# Patient Record
Sex: Female | Born: 1980 | Hispanic: Yes | Marital: Married | Smoking: Never smoker
Health system: Southern US, Community
[De-identification: ages and names within clinical notes are randomized; demographics above are authoritative.]

## PROBLEM LIST (undated history)

## (undated) DIAGNOSIS — L709 Acne, unspecified: Secondary | ICD-10-CM

## (undated) DIAGNOSIS — F419 Anxiety disorder, unspecified: Secondary | ICD-10-CM

## (undated) DIAGNOSIS — L309 Dermatitis, unspecified: Secondary | ICD-10-CM

## (undated) DIAGNOSIS — R0981 Nasal congestion: Secondary | ICD-10-CM

## (undated) HISTORY — DX: Acne, unspecified: L70.9

## (undated) HISTORY — DX: Dermatitis, unspecified: L30.9

## (undated) HISTORY — PX: TUBAL LIGATION: SHX77

---

## 2010-04-02 ENCOUNTER — Inpatient Hospital Stay: Payer: Self-pay | Admitting: Obstetrics and Gynecology

## 2012-05-10 ENCOUNTER — Ambulatory Visit: Payer: Self-pay | Admitting: Family Medicine

## 2012-05-10 IMAGING — US US OB < 14 WEEKS - US OB TV
1 series · 13 of 28 positions shown · non-contrast
Comparison: none

REASON FOR EXAM: Call Report   [PHONE_NUMBER]   pregnant with paragard
confirm dates placement ...
COMMENTS:

[Series 1: us ob < 14 weeks - us ob tv · 0.23mm/px · 13 of 101 slices shown]
[im 4/101]
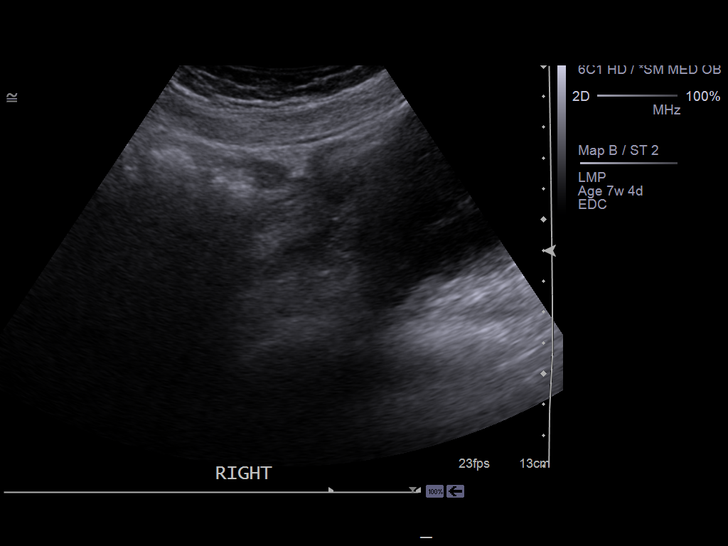
[im 12/101]
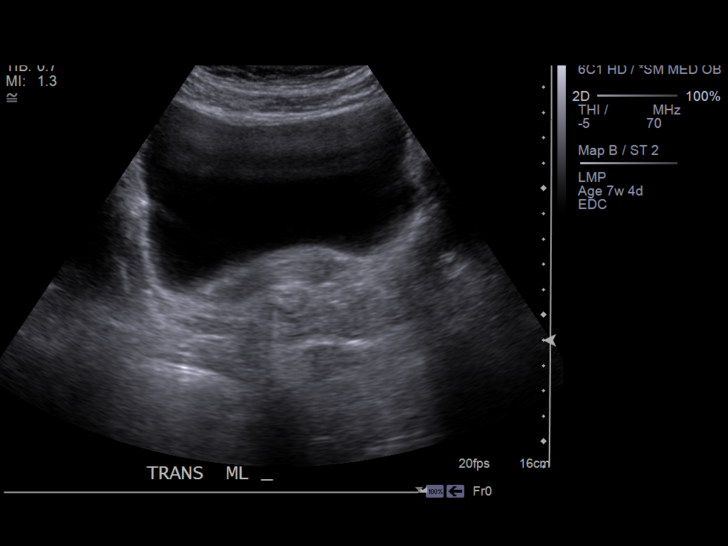
[im 19/101]
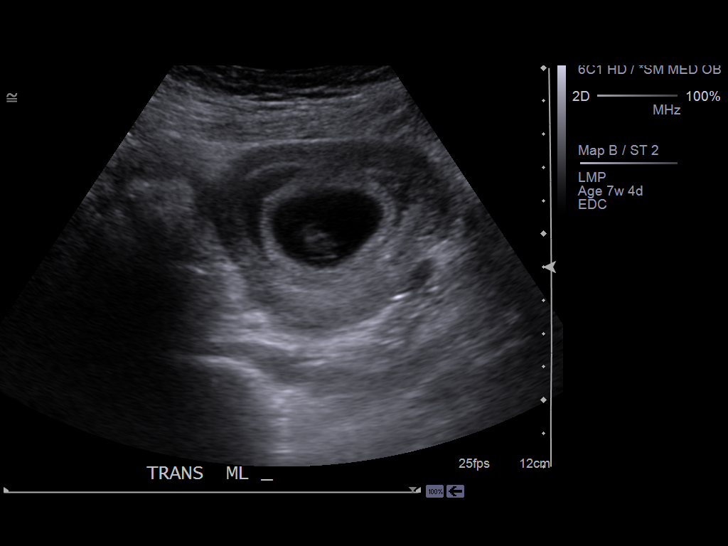
[im 26/101]
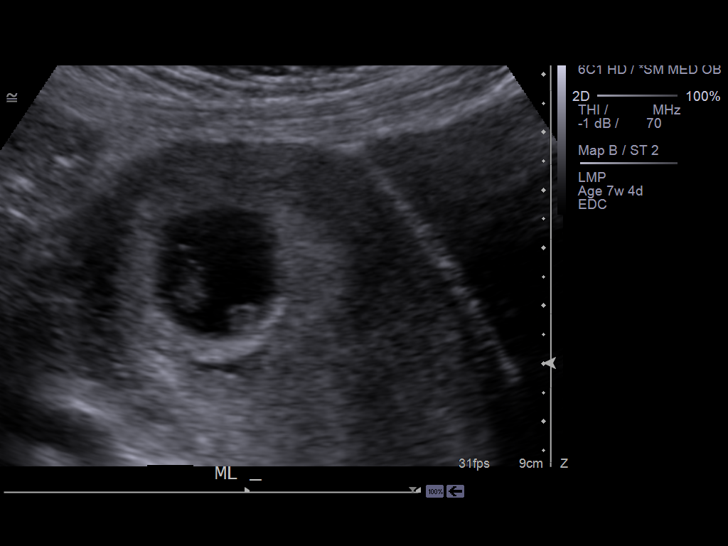
[im 34/101]
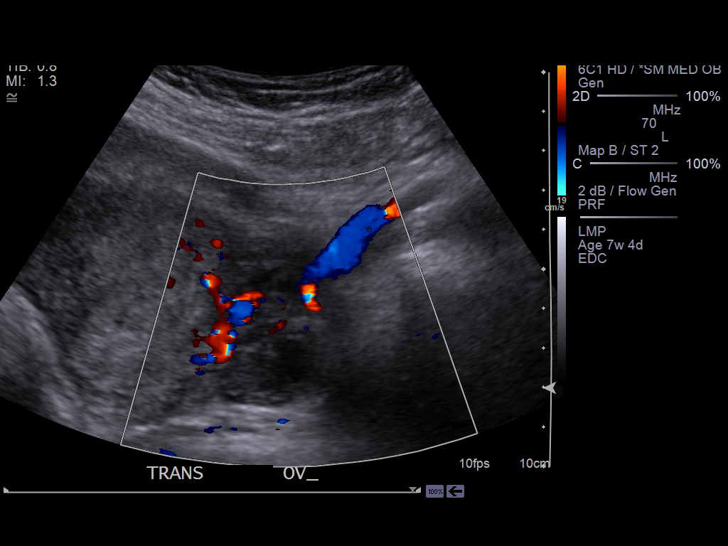
[im 41/101]
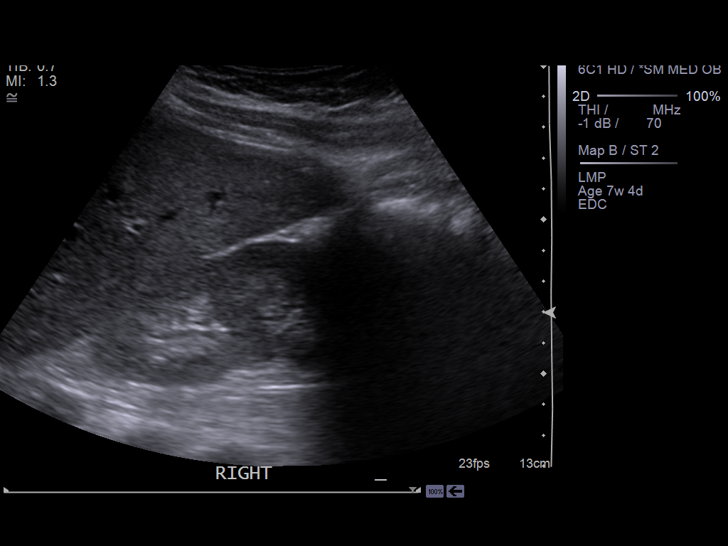
[im 52/101]
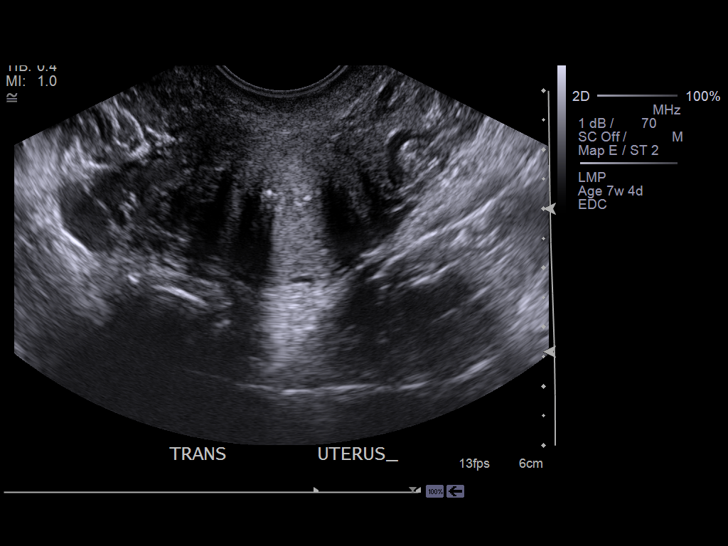
[im 60/101]
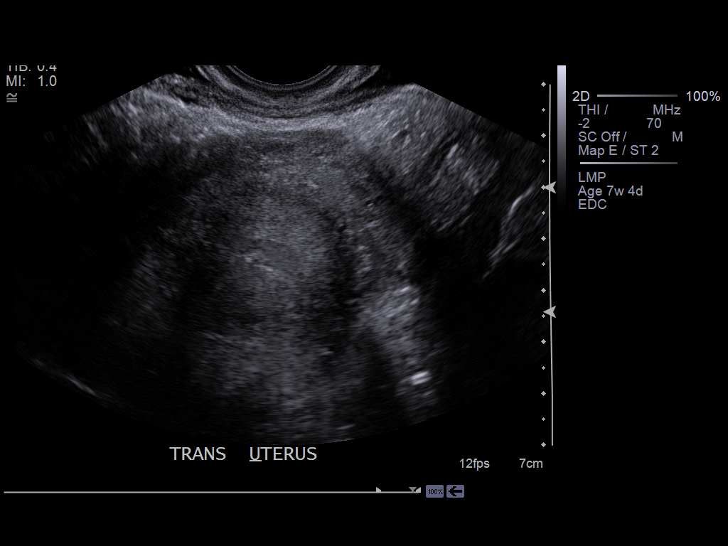
[im 67/101]
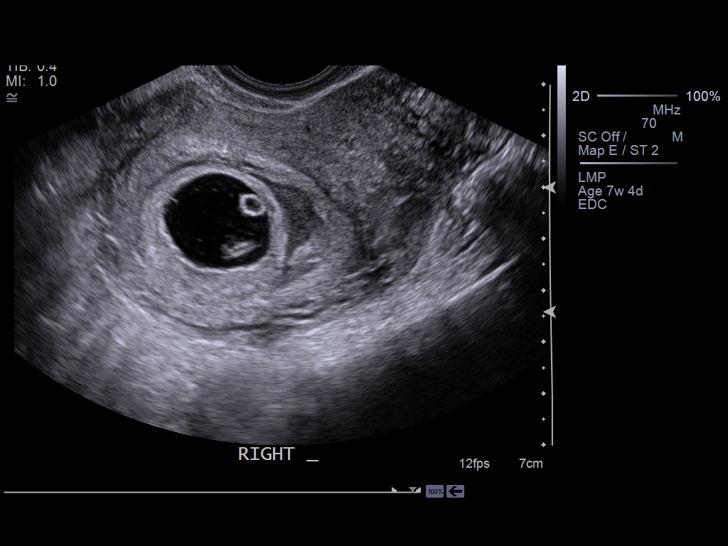
[im 75/101]
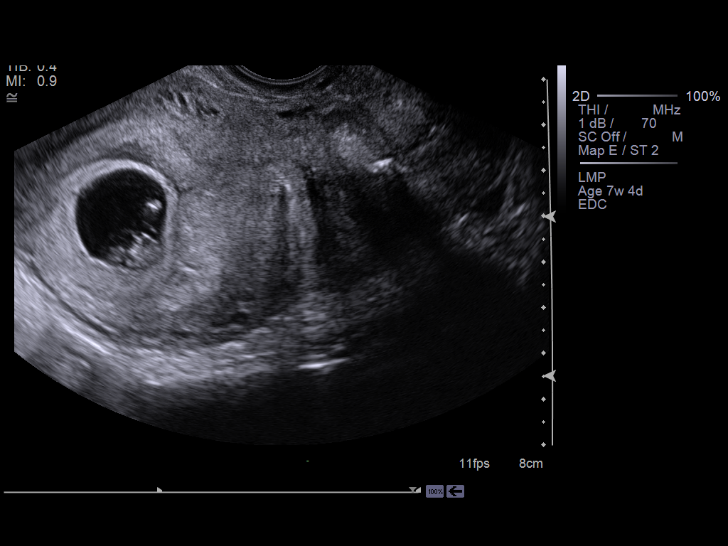
[im 82/101]
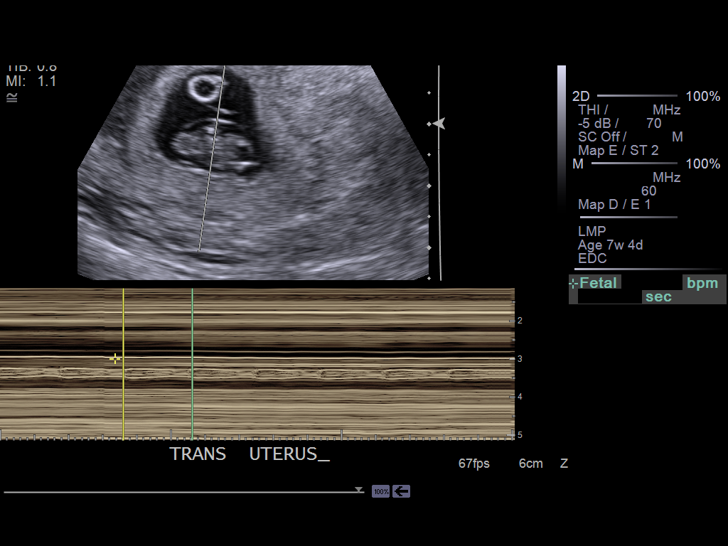
[im 89/101]
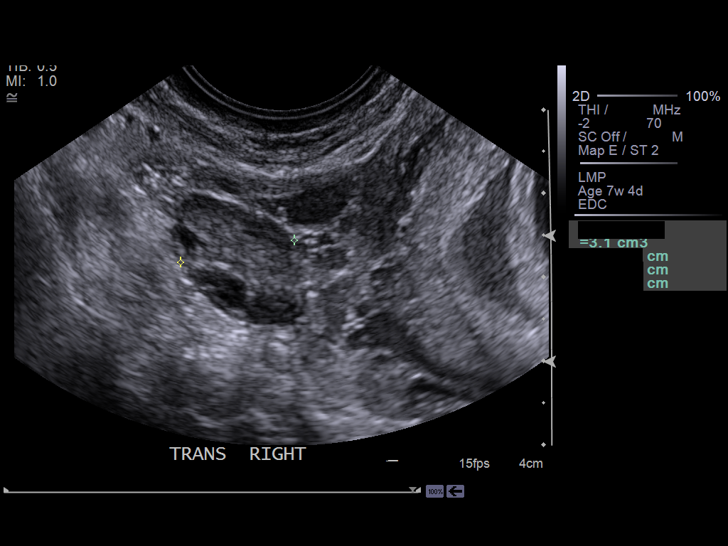
[im 97/101]
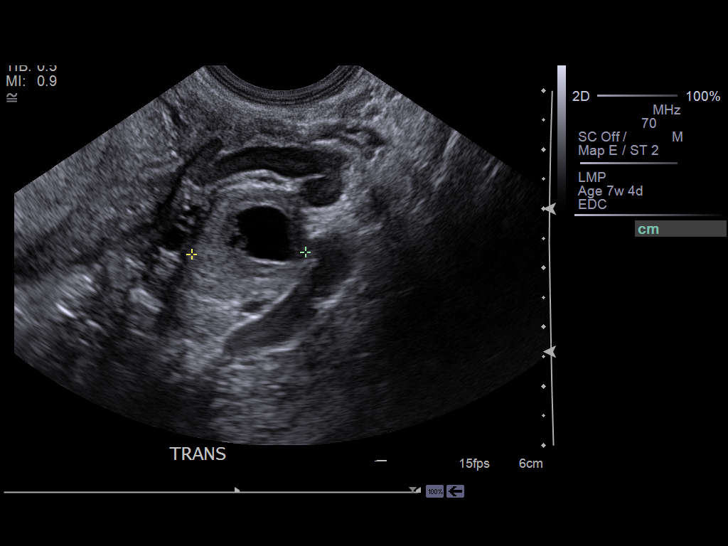

[13 of 28 positions shown; findings below may reference images not displayed]

PROCEDURE:     US  - US OB LESS THAN 14 WEEKS/W TRANS  - [DATE]  [DATE]

RESULT:     Early OB protocol pelvic sonogram shows a single intrauterine
fetus with a crown-rump length mean measurement of 1.29 cm consistent with 7
week 4 day gestation. Fetal heart rate is present 152 beats per minute. Yolk
sac diameter is 0.46 cm. The cervix length has a mean measurement of
cm. Right ovary measures 3.09 x 1.31 x 1.76 cm. The left ovary measures
x 1.88 x 2.96 cm. The in the maternal kidneys appear grossly normal. There
is an echogenic shadowing structure in the lower uterine segment. On
longitudinal and transverse examination this has a T shaped appearance
consistent with an intrauterine device. The amnion appears to be fairly
close to the fetus. High risk assessment followup is suggested. Subchorionic
hemorrhage appears to be present and is measured at 2.26 x 0.90 x 2.40 cm.
IMPRESSION: Intrauterine gestation as described. Amnion is fairly close
to the fetal surface. Fetal heart rate is 149 beats per minute. Possible
implantation subchorionic hemorrhage of 2.26 x 0.90 x 2.40 cm is present.
Follicles are seen in both ovaries with a left ovarian cyst possibly corpus
luteum cyst present.

[REDACTED](*)

## 2012-06-21 ENCOUNTER — Encounter: Payer: Self-pay | Admitting: Obstetrics and Gynecology

## 2012-06-21 IMAGING — US US OB < 14 WEEKS - US OB TV
1 series · 14 of 28 positions shown · non-contrast
Comparison: none

[Series 1: us ob < 14 weeks - us ob tv · 14 of 44 slices shown]
[im 2/44]
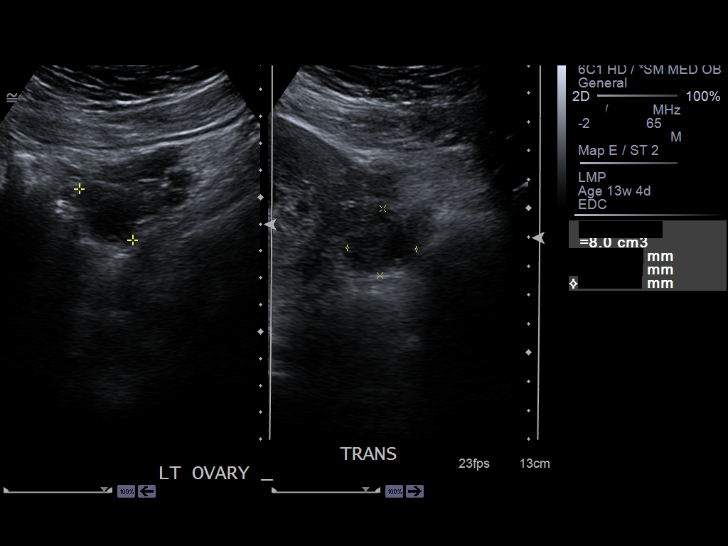
[im 5/44]
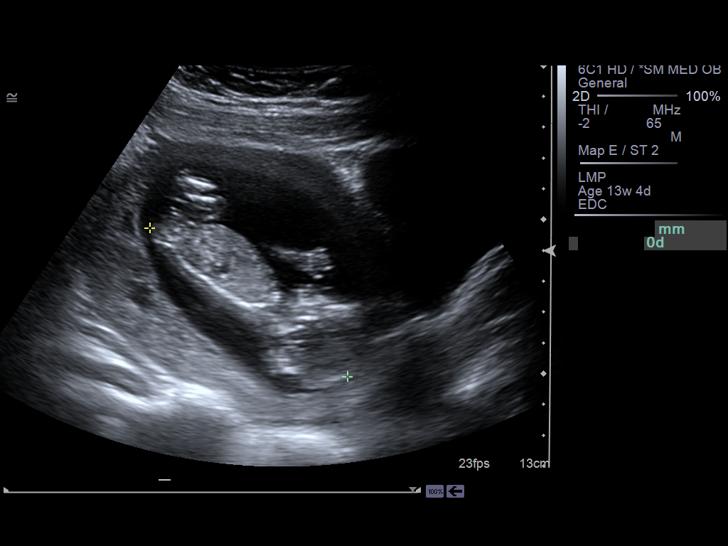
[im 8/44]
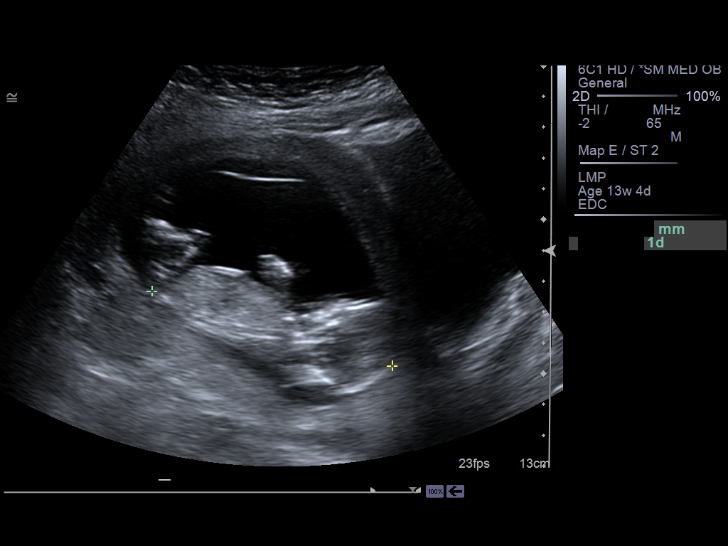
[im 12/44]
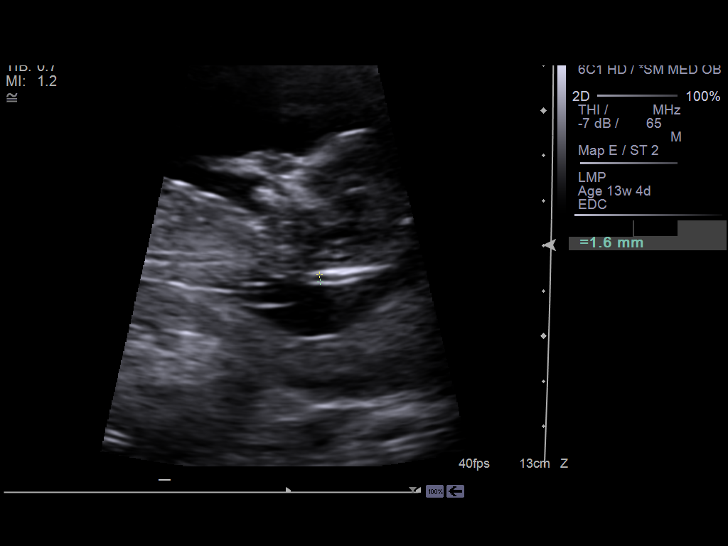
[im 15/44]
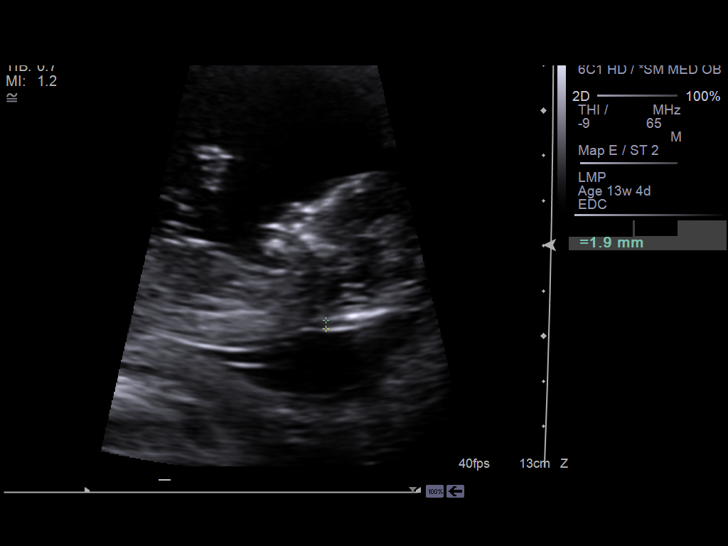
[im 18/44]
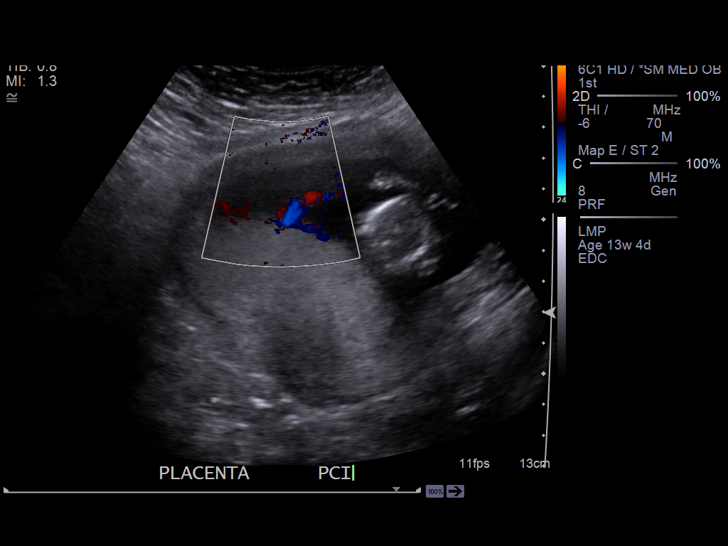
[im 21/44]
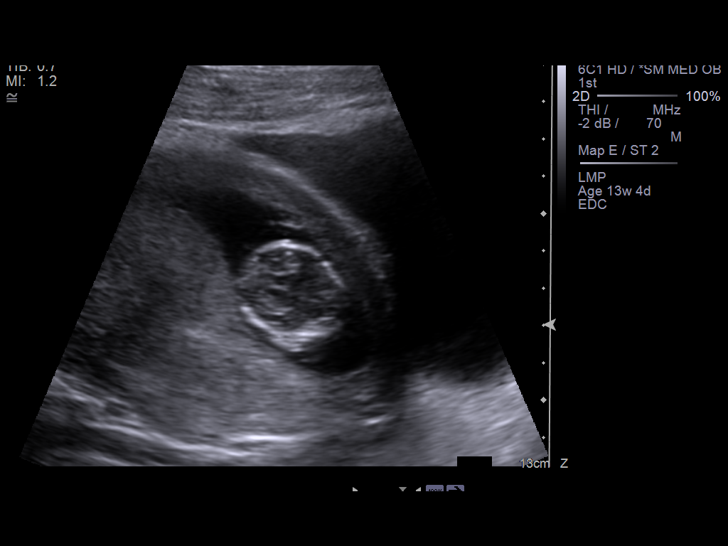
[im 24/44]
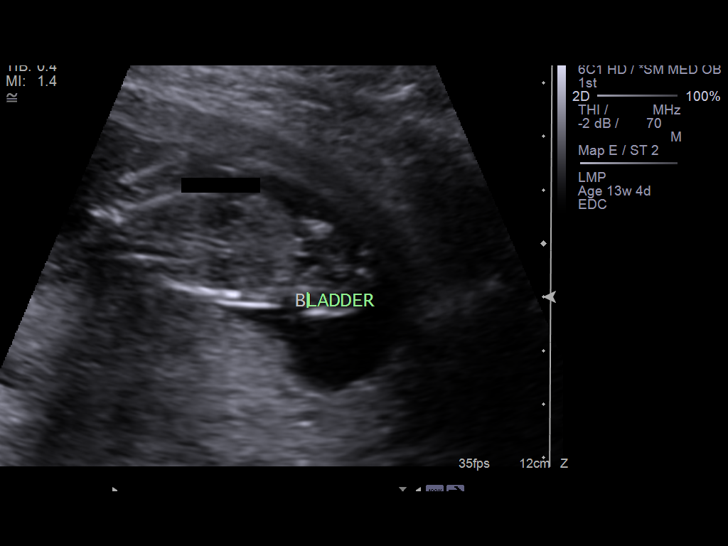
[im 28/44]
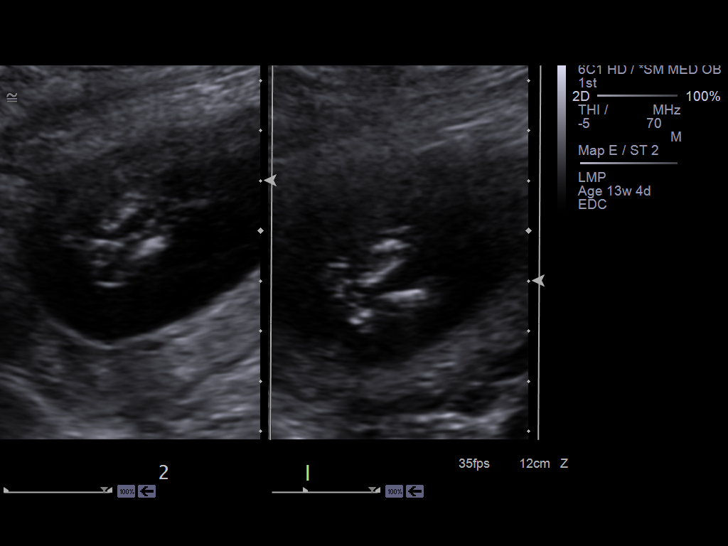
[im 31/44]
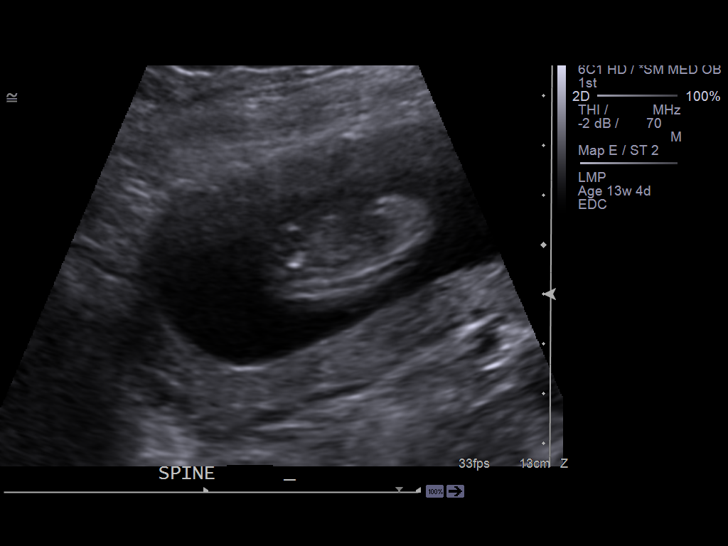
[im 34/44]
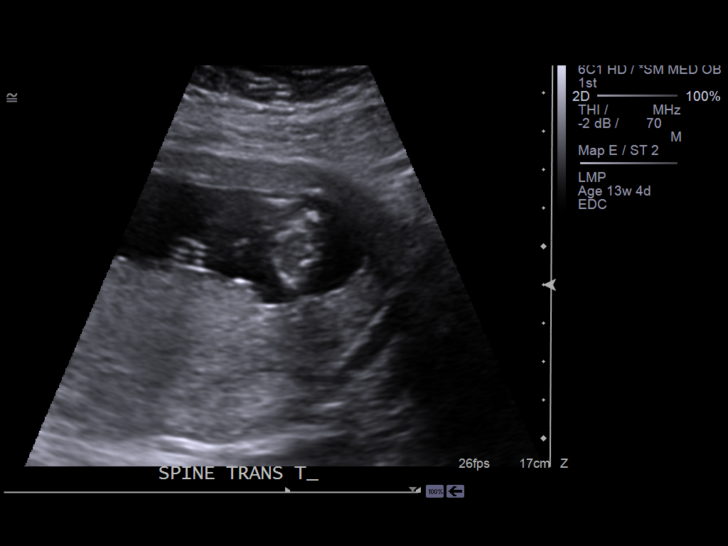
[im 37/44]
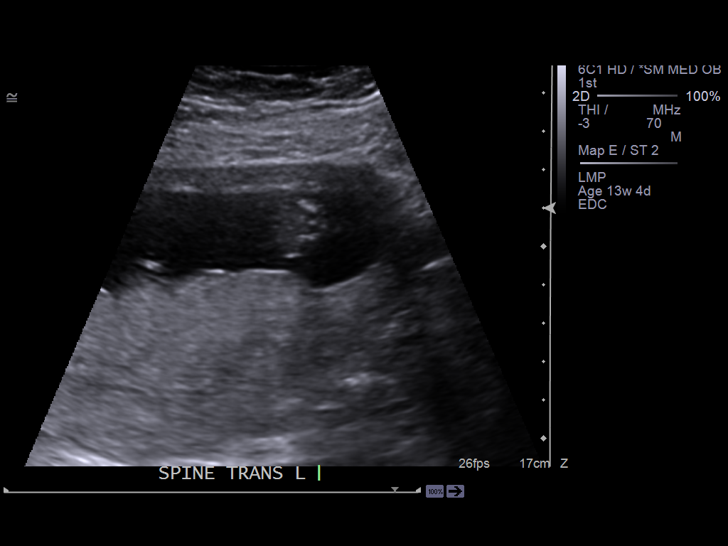
[im 40/44]
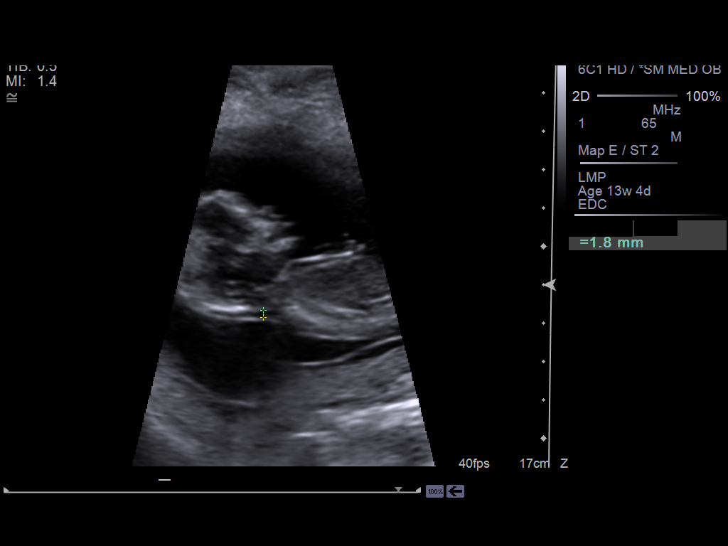
[im 44/44]
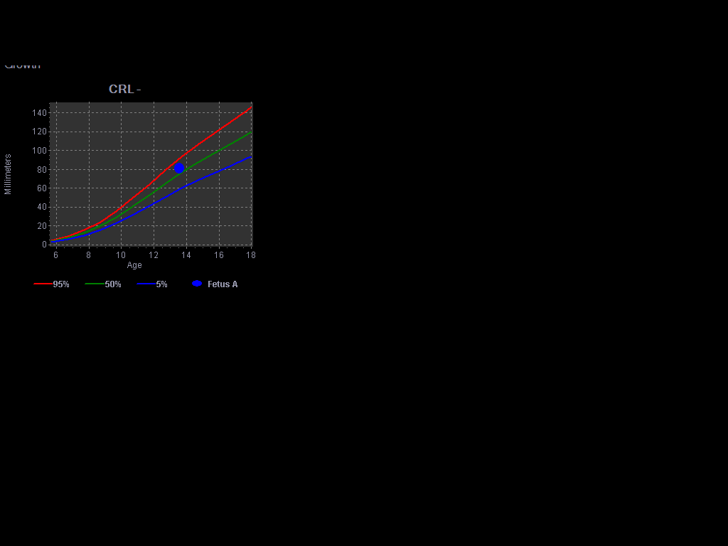

[14 of 28 positions shown; findings below may reference images not displayed]

IMAGES IMPORTED FROM THE [employer]NGO WORKFLOW SYSTEM
NO DICTATION FOR STUDY

## 2012-07-23 ENCOUNTER — Encounter: Payer: Self-pay | Admitting: Obstetrics and Gynecology

## 2012-07-23 IMAGING — US US OB DETAIL+14 WK - NRPT [facility]
1 series · 14 of 28 positions shown · non-contrast
Comparison: none

[Series 1: us ob detail+14 wk - nrpt [facility] · 0.26mm/px · 14 of 100 slices shown]
[im 4/100]
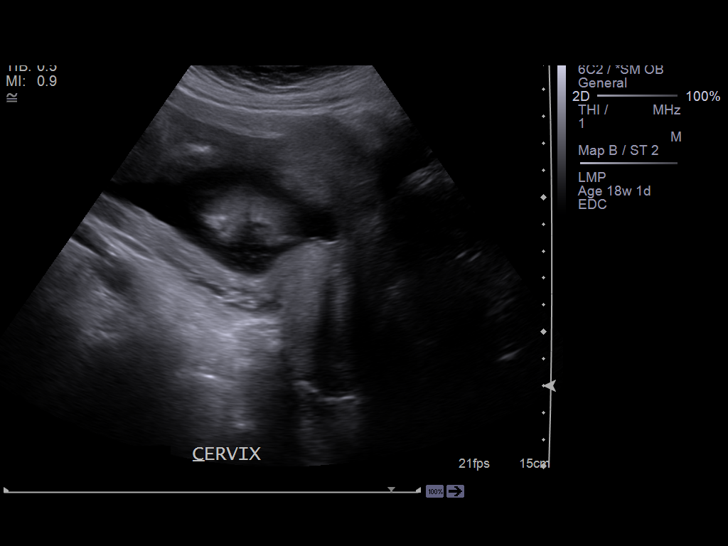
[im 12/100]
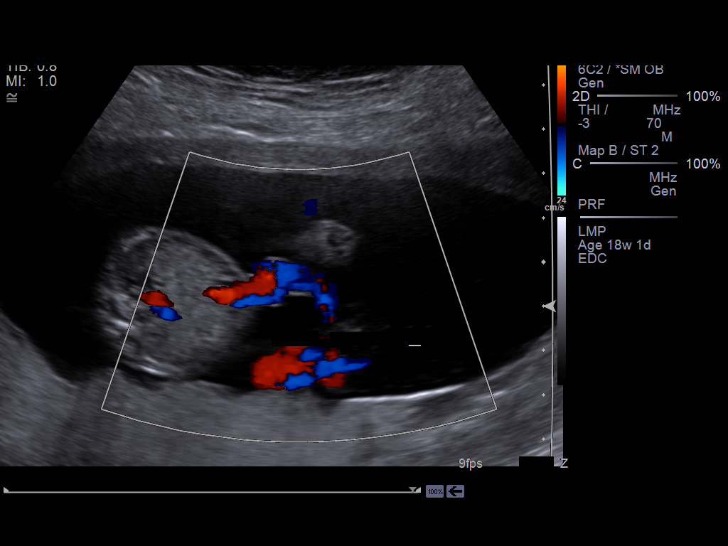
[im 19/100]
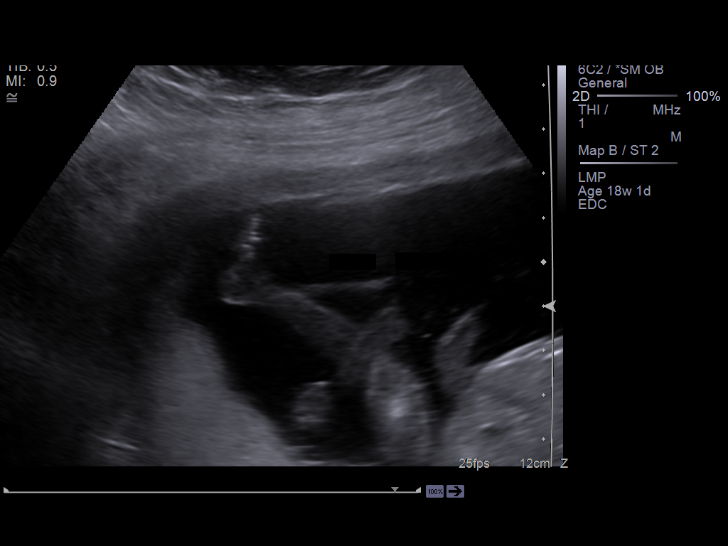
[im 26/100]
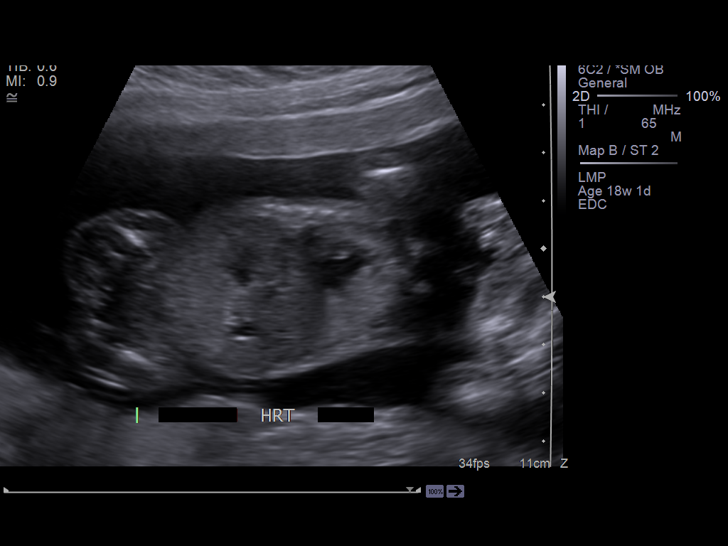
[im 34/100]
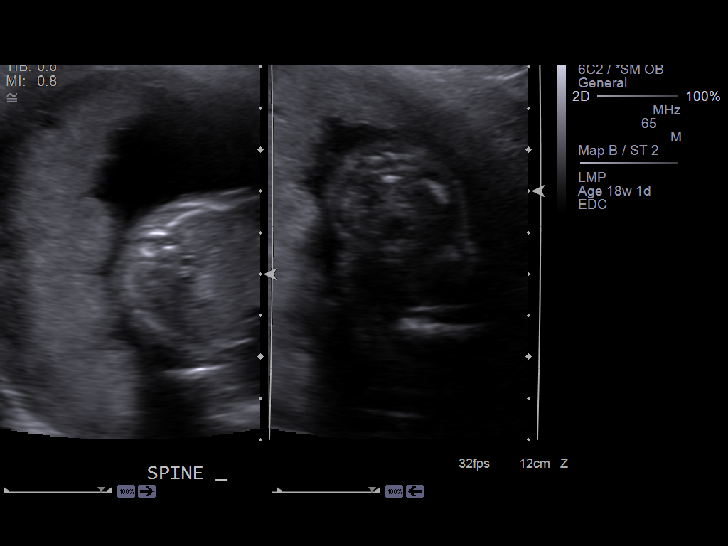
[im 41/100]
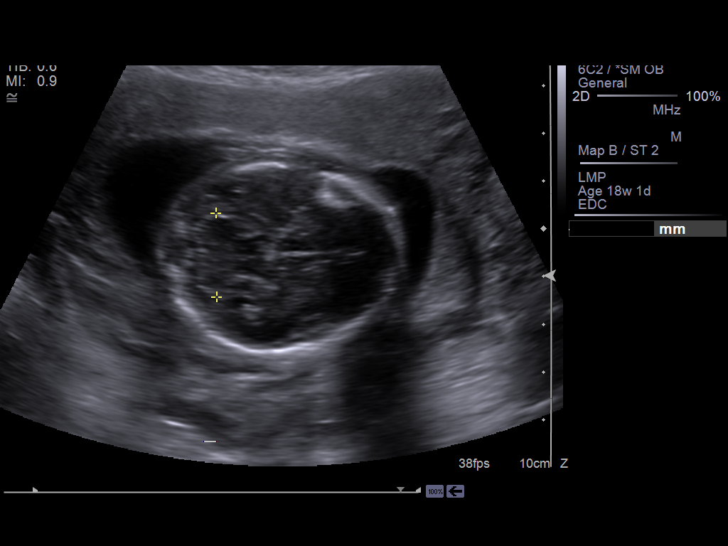
[im 48/100]
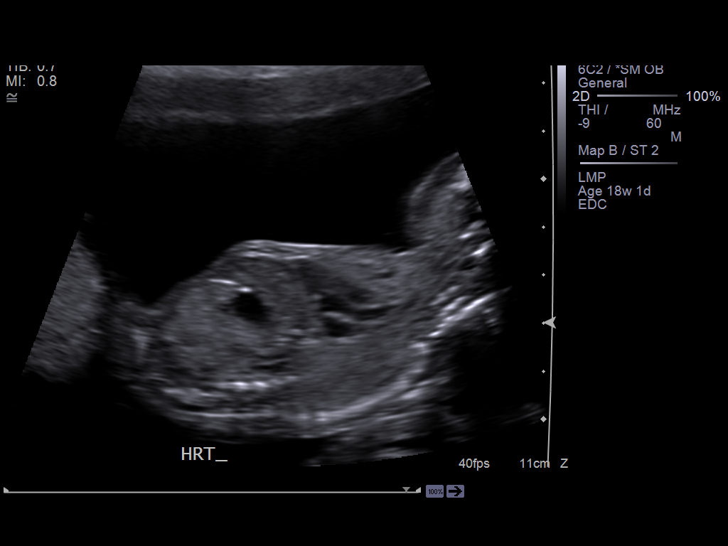
[im 56/100]
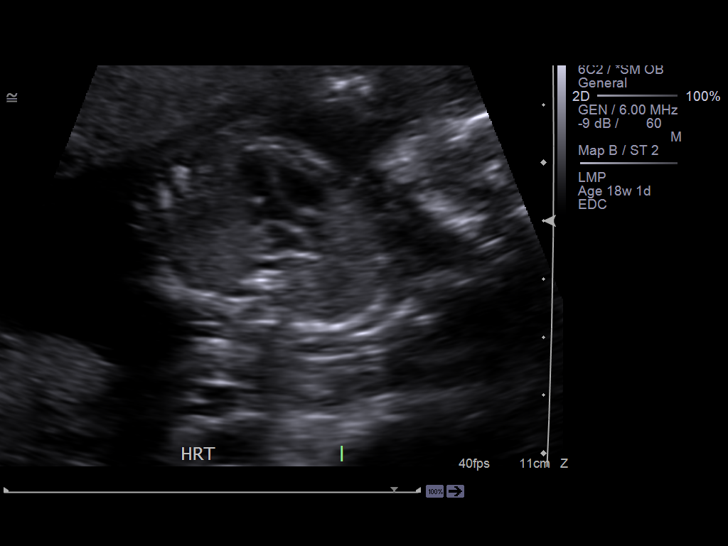
[im 63/100]
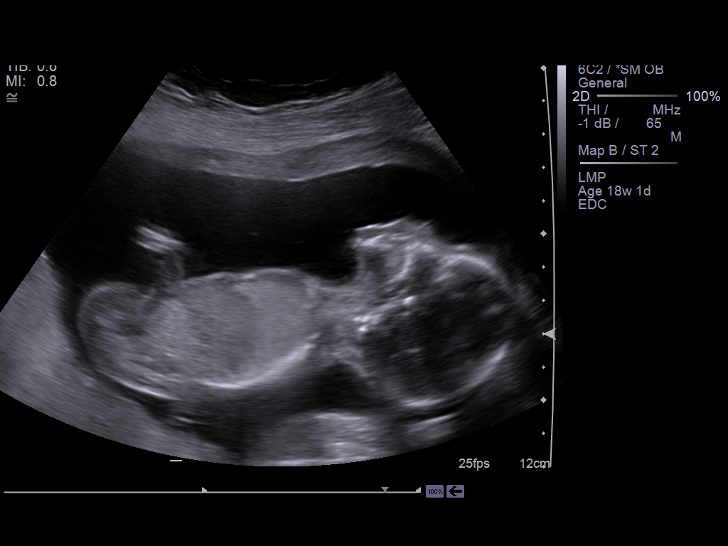
[im 70/100]
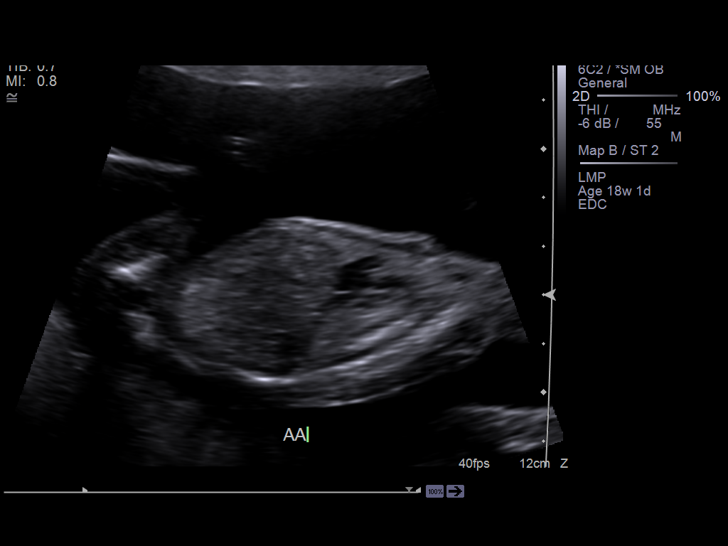
[im 78/100]
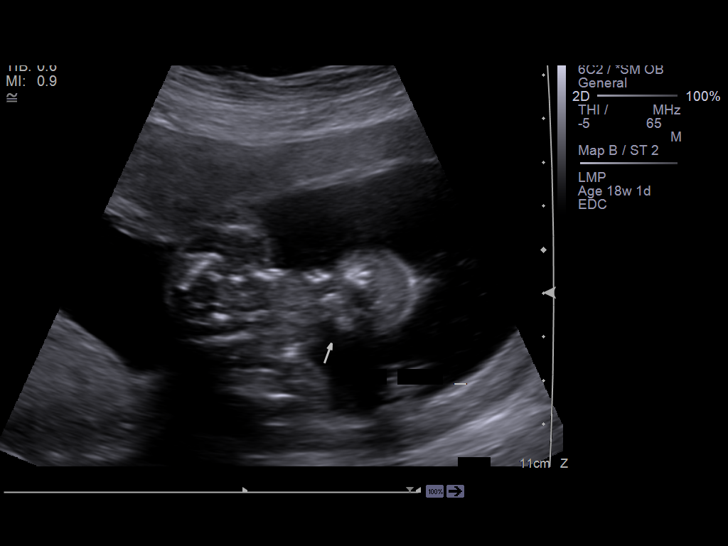
[im 85/100]
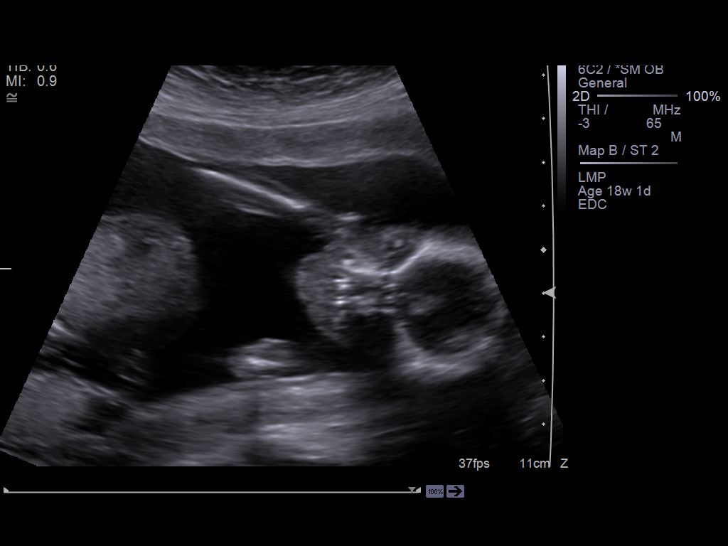
[im 92/100]
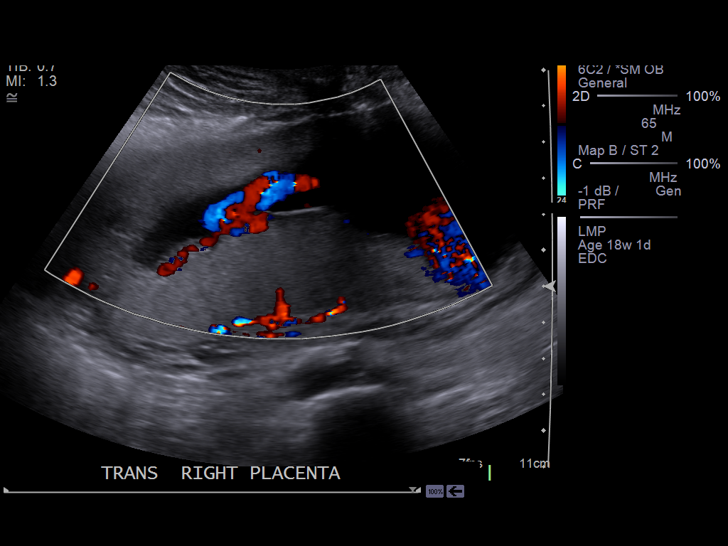
[im 100/100]
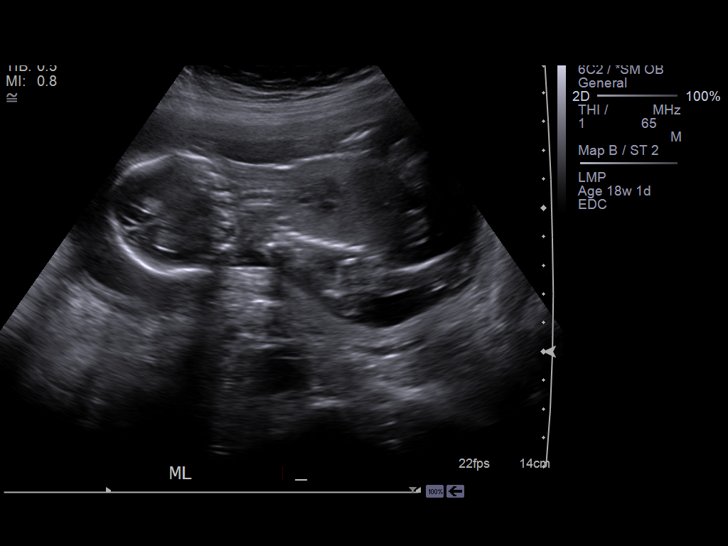

[14 of 28 positions shown; findings below may reference images not displayed]

IMAGES IMPORTED FROM THE SYNGO WORKFLOW SYSTEM
NO DICTATION FOR STUDY

## 2012-12-24 ENCOUNTER — Inpatient Hospital Stay: Payer: Self-pay

## 2012-12-25 LAB — CBC WITH DIFFERENTIAL/PLATELET
Basophil %: 0.2 %
Eosinophil %: 0.9 %
HCT: 35.8 % (ref 35.0–47.0)
HGB: 12.4 g/dL (ref 12.0–16.0)
Lymphocyte #: 2.7 10*3/uL (ref 1.0–3.6)
Lymphocyte %: 22.5 %
MCH: 32.2 pg (ref 26.0–34.0)
MCV: 93 fL (ref 80–100)
Monocyte #: 1 x10 3/mm — ABNORMAL HIGH (ref 0.2–0.9)
Monocyte %: 8.7 %
Neutrophil #: 8 10*3/uL — ABNORMAL HIGH (ref 1.4–6.5)
Platelet: 224 10*3/uL (ref 150–440)
RBC: 3.85 10*6/uL (ref 3.80–5.20)

## 2012-12-26 LAB — HEMATOCRIT: HCT: 36.5 % (ref 35.0–47.0)

## 2012-12-27 LAB — PATHOLOGY REPORT

## 2014-12-26 NOTE — Op Note (Signed)
PATIENT NAME:  Lynn Carroll, Lynn Carroll MR#:  [id number] DATE OF BIRTH:  02-07-1981  DATE OF PROCEDURE:  12/26/2012  PREOPERATIVE DIAGNOSIS: Elective permanent sterilization.   POSTOPERATIVE DIAGNOSIS: Elective permanent sterilization.   PROCEDURE: Bilateral tubal ligation, [place].   SURGEON:  Laverta Baltimore, MD  ANESTHESIA:  General endotracheal.  INDICATIONS: This is a 34 year old gravida 4 now para 3 status post vaginal delivery yesterday. The patient has elected for permanent sterilization. The patient reconfirms the desire for permanent sterilization with the translator present, on 12/26/2012.  The patient is aware of the failure rate of tubal ligation of 1 per 200.   DESCRIPTION OF PROCEDURE: After adequate general endotracheal anesthesia, the patient was placed in the dorsal supine position. The abdomen was prepped and draped in normal sterile fashion. A 15 mm infraumbilical incision was made after injecting with 0.5% Marcaine. The fascia was grasped with Kelly clamps and opened in a sharp fashion and the peritoneum was opened sharply as well. Attention was directed to the patient's right fallopian tube which was identified and grasped with a Babcock clamp and the fimbriated end was visualized. Two separate 0 plain gut sutures were applied at the midportion of the fallopian tube and a 1.5 cm portion of fallopian tube removed. Good hemostasis was noted.  Bovie was used for small bleeding to the tube. A similar procedure was repeated on the patient's left fallopian tube. After visualizing the fimbriated end, 2 separate 0 plain gut sutures were applied to the fallopian tube and a 1.5 cm portion of  fallopian tube removed. Good hemostasis was noted. Fascia was closed with a running 2-0 Vicryl suture.  The skin was reapproximated with 4-0 interrupted Vicryl. The skin was cleaned and prepped with Steri-Strips and a pressure dressing applied and Tegaderm       applied. There were no  complications. Estimated blood loss minimal. Inter-op fluids 400 mL. The patient tolerated the procedure well and was taken to the recovery room in good condition.  ____________________________ Boykin Nearing, MD tjs:sb D: 12/26/2012 11:10:00 ET T: 12/26/2012 11:20:02 ET JOB#: [id number]  cc: Boykin Nearing, MD, <Dictator> Boykin Nearing MD ELECTRONICALLY SIGNED 12/28/2012 9:49

## 2015-01-13 NOTE — H&P (Signed)
L&D Evaluation:  History:  HPI 34 y/o [id number] at 40/1wks [facility] 12/23/12 arrived with c/o regular contractions. Denies leaking fluid or vaginal bleeding. Baby is active. GBS negative.   Presents with contractions   Patient's Medical History No Chronic Illness   Patient's Surgical History none   Medications Pre Natal Vitamins   Allergies NKDA   Social History none   Family History Non-Contributory   ROS:  ROS All systems were reviewed.  HEENT, CNS, GI, GU, Respiratory, CV, Renal and Musculoskeletal systems were found to be normal.   Exam:  Vital Signs stable   Urine Protein not completed   General no apparent distress   Mental Status clear   Chest clear   Heart normal sinus rhythm   Abdomen gravid, non-tender   Estimated Fetal Weight Average for gestational age   Fetal Position vtx   Fundal Height term   Back no CVAT   Edema no edema   Reflexes 1+   Clonus negative   Pelvic no external lesions, 3-4cm upon arrival progressed to 7-8cm vtx @ 0 station BOWI nl show   Mebranes Intact   FHT normal rate with no decels, 130'-140's baseline avg variability with accels   Fetal Heart Rate 136   Ucx regular, Q 3 mins 60 sec strong   Skin dry   Lymph no lymphadenopathy   Impression:  Impression active labor   Plan:  Plan monitor contractions and for cervical change   Comments Breathing through uc's well, declines pain medicine. Knows what to expect, 3rd baby. FOB supportive at bedside.   Electronic Signatures: Rosie Fate (CNM)  (Signed 22-Apr-14 01:46)  Authored: L&D Evaluation   Last Updated: 22-Apr-14 01:46 by Rosie Fate (CNM)

## 2018-07-03 ENCOUNTER — Other Ambulatory Visit: Payer: Self-pay | Admitting: Podiatry

## 2018-07-03 ENCOUNTER — Encounter: Payer: Self-pay | Admitting: Podiatry

## 2018-07-03 ENCOUNTER — Ambulatory Visit (INDEPENDENT_AMBULATORY_CARE_PROVIDER_SITE_OTHER): Payer: BLUE CROSS/BLUE SHIELD

## 2018-07-03 ENCOUNTER — Ambulatory Visit: Payer: BLUE CROSS/BLUE SHIELD | Admitting: Podiatry

## 2018-07-03 VITALS — BP 131/91 | HR 85

## 2018-07-03 DIAGNOSIS — M722 Plantar fascial fibromatosis: Secondary | ICD-10-CM

## 2018-07-03 DIAGNOSIS — B351 Tinea unguium: Secondary | ICD-10-CM

## 2018-07-03 DIAGNOSIS — M79672 Pain in left foot: Principal | ICD-10-CM

## 2018-07-03 DIAGNOSIS — M79671 Pain in right foot: Secondary | ICD-10-CM

## 2018-07-03 MED ORDER — MELOXICAM 15 MG PO TABS: 15.0000 mg | ORAL_TABLET | Freq: Every day | ORAL | 1 refills | Status: DC

## 2018-07-03 MED ORDER — TERBINAFINE HCL 250 MG PO TABS: 250.0000 mg | ORAL_TABLET | Freq: Every day | ORAL | 0 refills | Status: DC

## 2018-07-04 NOTE — Progress Notes (Signed)
   Subjective: 37 year old female presenting today as a new patient with a chief complaint of bilateral foot pain that began about two years ago. She states the pain is located in the arches and is worse after walking for long periods of time. She has been wearing inserts in her shoes which provided some relief.  She also reports discoloration of the bilateral great toenails that began a few months ago. She denies modifying factors and has not done anything for treatment. Patient is here for further evaluation and treatment.   History reviewed. No pertinent past medical history.   Objective: Physical Exam General: The patient is alert and oriented x3 in no acute distress.  Dermatology: Hyperkeratotic, discolored, thickened, onychodystrophy of great toenails noted bilaterally. Skin is warm, dry and supple bilateral lower extremities. Negative for open lesions or macerations bilateral.   Vascular: Dorsalis Pedis and Posterior Tibial pulses palpable bilateral.  Capillary fill time is immediate to all digits.  Neurological: Epicritic and protective threshold intact bilateral.   Musculoskeletal: Tenderness to palpation to the plantar aspect of the bilateral heels along the plantar fascia. All other joints range of motion within normal limits bilateral. Strength 5/5 in all groups bilateral.   Radiographic exam: Normal osseous mineralization. Joint spaces preserved. No fracture/dislocation/boney destruction. No other soft tissue abnormalities or radiopaque foreign bodies.   Assessment: 1. plantar fasciitis bilateral feet 2. Onychomycosis bilateral hallux   Plan of Care:  1. Patient evaluated. Xrays reviewed.   2. Injection of 0.5cc Celestone soluspan injected into the bilateral heels.  3. Prescription for Meloxicam provided to patient.  4. Plantar fascial band(s) dispensed for bilateral plantar fasciitis. 5. Instructed patient regarding therapies and modalities at home to alleviate  symptoms.  6. Prescription for Lamisil 250 mg #90 provided to patient.  7. Return to clinic in 4 weeks.    Edrick Kins, DPM [facility]  Dr. Edrick Kins, DPM    [address], [place] [address]                Office [phone]  Fax [phone]

## 2018-07-31 ENCOUNTER — Encounter: Payer: Self-pay | Admitting: Podiatry

## 2018-07-31 ENCOUNTER — Ambulatory Visit (INDEPENDENT_AMBULATORY_CARE_PROVIDER_SITE_OTHER): Payer: BLUE CROSS/BLUE SHIELD | Admitting: Podiatry

## 2018-07-31 DIAGNOSIS — B351 Tinea unguium: Secondary | ICD-10-CM | POA: Diagnosis not present

## 2018-07-31 DIAGNOSIS — M722 Plantar fascial fibromatosis: Secondary | ICD-10-CM | POA: Diagnosis not present

## 2018-07-31 MED ORDER — MELOXICAM 15 MG PO TABS: 15.0000 mg | ORAL_TABLET | Freq: Every day | ORAL | 1 refills | Status: AC

## 2018-08-01 NOTE — Progress Notes (Signed)
   Subjective: 37 year old female presenting today for follow up evaluation of plantar fasciitis of bilateral feet and fungal bilateral great toenails. She states all her symptoms have improved. She notes some continued intermittent cramping on the lateral aspects of the feet but reports significant relief after receiving the injection. She has been taking Meloxicam and Lamisil as directed. Patient is here for further evaluation and treatment.   History reviewed. No pertinent past medical history.   Objective: Physical Exam General: The patient is alert and oriented x3 in no acute distress.  Dermatology: Hyperkeratotic, discolored, thickened, onychodystrophy of great toenails noted bilaterally. Skin is warm, dry and supple bilateral lower extremities. Negative for open lesions or macerations bilateral.   Vascular: Dorsalis Pedis and Posterior Tibial pulses palpable bilateral.  Capillary fill time is immediate to all digits.  Neurological: Epicritic and protective threshold intact bilateral.   Musculoskeletal: Tenderness to palpation to the plantar aspect of the bilateral heels along the plantar fascia. All other joints range of motion within normal limits bilateral. Strength 5/5 in all groups bilateral.   Assessment: 1. plantar fasciitis bilateral feet 2. Onychomycosis bilateral hallux   Plan of Care:  1. Patient evaluated.    2. Injection of 0.5cc Celestone soluspan injected into the bilateral midsubstance.  3. Refill prescription for Meloxicam provided to patient.  4. Continue taking Lamisil 250 mg as prescribed at last visit.  5. Continue using plantar fascial braces.  6. Appointment with Liliane Channel, Pedorthist, for custom molded orthotics.  7. Return to clinic as needed.    Edrick Kins, DPM [facility]  Dr. Edrick Kins, DPM    [address], [place] [address]                Office [phone]  Fax [phone]

## 2018-08-15 ENCOUNTER — Ambulatory Visit (INDEPENDENT_AMBULATORY_CARE_PROVIDER_SITE_OTHER): Payer: BLUE CROSS/BLUE SHIELD | Admitting: Orthotics

## 2018-08-15 DIAGNOSIS — M722 Plantar fascial fibromatosis: Secondary | ICD-10-CM

## 2018-08-15 NOTE — Progress Notes (Signed)

## 2018-09-12 ENCOUNTER — Other Ambulatory Visit: Payer: BLUE CROSS/BLUE SHIELD | Admitting: Orthotics

## 2018-09-12 ENCOUNTER — Ambulatory Visit: Payer: BLUE CROSS/BLUE SHIELD | Admitting: Orthotics

## 2018-09-12 DIAGNOSIS — M722 Plantar fascial fibromatosis: Secondary | ICD-10-CM

## 2018-09-12 NOTE — Progress Notes (Signed)
Patient came in today to pick up custom made foot orthotics.  The goals were accomplished and the patient reported no dissatisfaction with said orthotics.  Patient was advised of breakin period and how to report any issues. 

## 2018-10-10 ENCOUNTER — Other Ambulatory Visit: Payer: BLUE CROSS/BLUE SHIELD | Admitting: Orthotics

## 2019-08-16 ENCOUNTER — Ambulatory Visit: Payer: BLUE CROSS/BLUE SHIELD | Admitting: Podiatry

## 2019-08-16 ENCOUNTER — Other Ambulatory Visit: Payer: Self-pay

## 2019-10-01 DIAGNOSIS — J302 Other seasonal allergic rhinitis: Secondary | ICD-10-CM | POA: Insufficient documentation

## 2019-10-01 DIAGNOSIS — N926 Irregular menstruation, unspecified: Secondary | ICD-10-CM | POA: Insufficient documentation

## 2019-10-01 DIAGNOSIS — G8929 Other chronic pain: Secondary | ICD-10-CM | POA: Insufficient documentation

## 2019-10-01 DIAGNOSIS — M5440 Lumbago with sciatica, unspecified side: Secondary | ICD-10-CM | POA: Insufficient documentation

## 2019-10-01 DIAGNOSIS — L089 Local infection of the skin and subcutaneous tissue, unspecified: Secondary | ICD-10-CM | POA: Insufficient documentation

## 2019-10-15 ENCOUNTER — Encounter: Payer: Self-pay | Admitting: Podiatry

## 2019-10-15 ENCOUNTER — Ambulatory Visit: Payer: 59 | Admitting: Podiatry

## 2019-10-15 ENCOUNTER — Other Ambulatory Visit: Payer: Self-pay

## 2019-10-15 ENCOUNTER — Ambulatory Visit (INDEPENDENT_AMBULATORY_CARE_PROVIDER_SITE_OTHER): Payer: 59

## 2019-10-15 DIAGNOSIS — M7752 Other enthesopathy of left foot: Secondary | ICD-10-CM

## 2019-10-15 DIAGNOSIS — M722 Plantar fascial fibromatosis: Secondary | ICD-10-CM

## 2019-10-15 DIAGNOSIS — M7751 Other enthesopathy of right foot: Secondary | ICD-10-CM | POA: Diagnosis not present

## 2019-10-15 MED ORDER — METHYLPREDNISOLONE 4 MG PO TBPK: ORAL_TABLET | ORAL | 0 refills | Status: DC

## 2019-10-16 NOTE — Progress Notes (Signed)
   Subjective: 39 y.o. female presenting today with a chief complaint of burning pain of the bilateral heels and arches that began about 6-8 months ago. She states the pain is worse with ambulation and at the end of the day. She has been taking OTC Tylenol, icing and stretching the feet for treatment. She is interested in EPAT. Patient is here for further evaluation and treatment.  No past medical history on file.   Objective: Physical Exam General: The patient is alert and oriented x3 in no acute distress.  Dermatology: Skin is warm, dry and supple bilateral lower extremities. Negative for open lesions or macerations bilateral.   Vascular: Dorsalis Pedis and Posterior Tibial pulses palpable bilateral.  Capillary fill time is immediate to all digits.  Neurological: Epicritic and protective threshold intact bilateral.   Musculoskeletal: Tenderness to palpation to the plantar aspect of the bilateral heels along the plantar fascia as well as to the 2nd MPJ of the bilateral feet. All other joints range of motion within normal limits bilateral. Strength 5/5 in all groups bilateral.   Radiographic exam: Normal osseous mineralization. Joint spaces preserved. No fracture/dislocation/boney destruction. No other soft tissue abnormalities or radiopaque foreign bodies.   Assessment: 1. plantar fasciitis bilateral feet 2. 2nd MPJ capsulitis bilateral   Plan of Care:  1. Patient evaluated. Xrays reviewed.   2. Injection of 0.5cc Celestone soluspan injected into the bilateral 2nd MPJs. 3. Plantar fascial braces dispensed.  4. Prescription for Medrol Dose Pak provided to patient. 5. Patient is allergic to NSAIDs.  6. Return to clinic as needed.   Cleans houses.      Edrick Kins, DPM [facility]  Dr. Edrick Kins, DPM    [address], [place] [address]                Office [phone]  Fax [phone]

## 2019-12-16 ENCOUNTER — Other Ambulatory Visit: Payer: Self-pay

## 2019-12-16 ENCOUNTER — Ambulatory Visit: Payer: 59 | Admitting: Dermatology

## 2019-12-16 DIAGNOSIS — L219 Seborrheic dermatitis, unspecified: Secondary | ICD-10-CM

## 2019-12-16 DIAGNOSIS — L308 Other specified dermatitis: Secondary | ICD-10-CM

## 2019-12-16 DIAGNOSIS — L700 Acne vulgaris: Secondary | ICD-10-CM | POA: Diagnosis not present

## 2019-12-16 DIAGNOSIS — L309 Dermatitis, unspecified: Secondary | ICD-10-CM

## 2019-12-16 MED ORDER — ADAPALENE 0.3 % EX GEL: CUTANEOUS | 3 refills | Status: DC

## 2019-12-16 MED ORDER — TRIAMCINOLONE ACETONIDE 0.1 % EX CREA: TOPICAL_CREAM | CUTANEOUS | 1 refills | Status: DC

## 2019-12-16 NOTE — Progress Notes (Signed)
   Follow-Up Visit   Subjective  Lynn Carroll is a 39 y.o. female who presents for the following: Follow-up.  Patient currently being treated for acne. She is using adapalene 0.3% gel sporadically (has old tube), Doxycycline 100 mg qd, and metronidazole cream. Pt has used Accutane in the past.  She still has a lot of breakouts. 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 Patient has hand dermatitis and is using triamcinolone 0.1% cream.  The following portions of the chart were reviewed this encounter and updated as appropriate:     Review of Systems: No other skin or systemic complaints.  Objective  Well appearing patient in no apparent distress; mood and affect are within normal limits.  A focused examination was performed including face, hands. Relevant physical exam findings are noted in the Assessment and Plan.  Objective  Face: Multiple comedones, some inflamed, of the left temple > right temple, chin, glabella.  Objective  Hands: Hyperkeratosis on right thumb  Objective  Perinasal ala: Clear today.  Assessment & Plan  Acne vulgaris Face  Continue adapalene 0.3% gel qhs. Topical retinoid medications like tretinoin/Retin-A, adapalene/Differin, tazarotene/Fabior, and Epiduo/Epiduo Forte can cause dryness and irritation when first started. Only apply a pea-sized amount to the entire affected area. Avoid applying it around the eyes, edges of mouth and creases at the nose. If you experience irritation, use a good moisturizer first and/or apply the medicine less often. If you are doing well with the medicine, you can increase how often you use it until you are applying every night. Be careful with sun protection while using this medication as it can make you sensitive to the sun. This medicine should not be used by pregnant women.   Continue metronidazole cream qd  prn.  Continue doxycycline 100mg  take 1 po qd. Doxycycline should be taken with food to prevent nausea. Do not lay down for 30 minutes after taking. Be cautious with sun exposure and use good sun protection while on this medication. Pregnant women should not take this medication.     Adapalene (DIFFERIN) 0.3 % gel - Face  Hand dermatitis Hands  Start Keradan cream Apply to AA BID until improved. Sample given. Eucerin Roughness Relief cream sample given.  Continue Triamcinolone 0.1% Cream qd/bid prn flares. Avoid face, groin, axilla.   triamcinolone cream (KENALOG) 0.1 % - Hands  Seborrheic dermatitis Perinasal ala  Continue ketoconazole 2% Cream qd/bid prn.  Return in about 4 months (around 04/16/2020).   IJamesetta Orleans, CMA, am acting as scribe for Brendolyn Patty, MD .

## 2019-12-17 ENCOUNTER — Other Ambulatory Visit: Payer: Self-pay

## 2019-12-17 MED ORDER — DOXYCYCLINE MONOHYDRATE 100 MG PO CAPS: 100.0000 mg | ORAL_CAPSULE | Freq: Every day | ORAL | 2 refills | Status: DC

## 2019-12-17 NOTE — Progress Notes (Signed)
Sent in doxycycline 100mg  1 po QD dsp #30 2Rf.

## 2019-12-31 DIAGNOSIS — K648 Other hemorrhoids: Secondary | ICD-10-CM | POA: Insufficient documentation

## 2020-03-03 ENCOUNTER — Ambulatory Visit: Payer: Self-pay | Admitting: General Surgery

## 2020-03-03 NOTE — H&P (Signed)
HISTORY OF PRESENT ILLNESS:    [name] is a 39 y.o.female patient who comes for follow up her acute over chronic anal fissure.  Patient reported that she continue with persistent pain for the last 6 weeks.  The pain is severe.  The pain exacerbated by bowel movement.  She does feel that his perianal area is very tight and has significant spasms that cause her pain.  There has been no alleviating factor.  Patient has tried nifedipine cream for 6 weeks.  She has been trying to increase her fiber in her diet.  She has also tried castor oil.  She reported she continued feeling significantly constipated.  The pain does not radiate to other part of the body.      PAST MEDICAL HISTORY:      Past Medical History:  Diagnosis Date  . Allergy   . Anxiety   . Arthritis   . Carpal tunnel syndrome   . Chronic tension headaches   . Hemorrhoids   . Seasonal allergies         PAST SURGICAL HISTORY:        Past Surgical History:  Procedure Laterality Date  . TUBAL LIGATION           MEDICATIONS:  Encounter Medications        Outpatient Encounter Medications as of 03/03/2020  Medication Sig Dispense Refill  . adapalene (DIFFERIN) 0.3 % topical gel APPLY A PEA SIZE AMOUNT TO THE FACE EVERY NIGHT as tolerated    . calcium carbonate-vitamin D3 (CALTRATE 600+D) 600 mg(1,500mg ) -200 unit tablet Take 1 tablet by mouth 2 (two) times daily with meals    . cholecalciferol (VITAMIN D3) 1000 unit capsule Take 1 capsule (1,000 Units total) by mouth once daily 360 capsule 11  . doxycycline (VIBRA-TABS) 100 MG tablet Take 100 mg by mouth once daily       . FIBER-CAPS, PSYLLIUM HUSK, ORAL Take by mouth once daily    . fluticasone propionate (FLONASE) 50 mcg/actuation nasal spray Place 2 sprays into both nostrils once daily 16 g 1  . ipratropium (ATROVENT) 21 mcg (0.03 %) nasal spray Place 2 sprays into both nostrils 2 (two) times daily 30 mL 11  . metroNIDAZOLE (METROCREAM) 0.75 %  cream APPLY A SMALL AMOUNT TO AFFECTED AREA ON FACE TWICE DAILY    . multivitamin/iron/folic acid (CENTRUM WOMEN ORAL) Take by mouth    . norethindrone (MICRONOR) 0.35 mg tablet Take 1 tablet (0.35 mg total) by mouth once daily 1 Package 11  . triamcinolone (NASACORT AQ) 55 mcg nasal spray Place 2 sprays into both nostrils once daily 1 Inhaler 12  . triamcinolone 0.1 % cream APPLY TO AFFECTED AREAS HANDS FINGERS ONCE DAILY TO TWICE DAILY AS NEEDED FOR FLARES. AVOID FACE GROIN UNDERARMS    . TURMERIC ORAL Take 500 mg by mouth    . Compound Medication Med Name: Nifedipine 0.3% plus lidocaine 2% cream. Apply to anal area two times a day and after every bowel movement. 1 each 0  . [DISCONTINUED] ASHWAGANDHA ROOT EXTRACT ORAL Take 470 mg by mouth (Patient not taking: Reported on 03/03/2020  )    . [DISCONTINUED] Compound Medication Med Name: Nifedipine 0.3% plus lidocaine 2% cream. Apply to anal area two times a day and after every bowel movement. (Patient not taking: Reported on 03/03/2020  ) 1 each 0  . [DISCONTINUED] ketoconazole (NIZORAL) 2 % cream Apply     as directed QD/BID to AA's PRN rash face (  Patient not taking: Reported on 03/03/2020)     No facility-administered encounter medications on file as of 03/03/2020.       ALLERGIES:   Meloxicam and Nsaids (non-steroidal anti-inflammatory drug)   SOCIAL HISTORY:  Social History          Socioeconomic History  . Marital status: Unknown    Spouse name: Not on file  . Number of children: Not on file  . Years of education: Not on file  . Highest education level: Not on file  Occupational History  . Not on file  Tobacco Use  . Smoking status: Never Smoker  . Smokeless tobacco: Never Used  Vaping Use  . Vaping Use: Never used  Substance and Sexual Activity  . Alcohol use: Yes    Comment: 2 a 3 por semana ; una lata de [employer] hard lemonade. vino  . Drug use: Never  . Sexual activity: Yes    Partners: Male     Birth control/protection: Surgical, Other-see comments  Other Topics Concern  . Not on file  Social History Narrative  . Not on file   Social Determinants of Health      Financial Resource Strain:   . Difficulty of Paying Living Expenses:   Food Insecurity:   . Worried About Charity fundraiser in the Last Year:   . Arboriculturist in the Last Year:   Transportation Needs:   . Film/video editor (Medical):   Marland Kitchen Lack of Transportation (Non-Medical):       FAMILY HISTORY:       Family History  Problem Relation Age of Onset  . Asthma Father   . Cancer Maternal Grandfather   . Arthritis Paternal Grandmother   . Arthritis Paternal Grandfather   . Depression Mother      GENERAL REVIEW OF SYSTEMS:   General ROS: negative for - chills, fatigue, fever, weight gain or weight loss Allergy and Immunology ROS: negative for - hives  Hematological and Lymphatic ROS: negative for - bleeding problems or bruising, negative for palpable nodes Endocrine ROS: negative for - heat or cold intolerance, hair changes Respiratory ROS: negative for - cough, shortness of breath or wheezing Cardiovascular ROS: no chest pain or palpitations GI ROS: negative for nausea, vomiting, abdominal pain, diarrhea, positive for constipation Musculoskeletal ROS: negative for - joint swelling or muscle pain Neurological ROS: negative for - confusion, syncope Dermatological ROS: negative for pruritus and rash  PHYSICAL EXAM:     Vitals:   03/03/20 0905  BP: 133/87  Pulse: 79  .  Ht:160 cm (5\' 3" ) Wt:71.2 kg (157 lb) JME:QAST surface area is 1.78 meters squared. Body mass index is 27.81 kg/m.Marland Kitchen   GENERAL: Alert, active, oriented x3  HEENT: Pupils equal reactive to light. Extraocular movements are intact. Sclera clear. Palpebral conjunctiva normal red color.Pharynx clear.  NECK: Supple with no palpable mass and no adenopathy.  LUNGS: Sound clear with no rales rhonchi or  wheezes.  HEART: Regular rhythm S1 and S2 without murmur.  ABDOMEN: Soft and depressible, nontender with no palpable mass, no hepatomegaly.   RECTAL: Very tight sphincter today unable to appreciate the fissure that was seen before.  No hemorrhoids.  Painful on examination.  EXTREMITIES: Well-developed well-nourished symmetrical with no dependent edema.  NEUROLOGICAL: Awake alert oriented, facial expression symmetrical, moving all extremities.      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  Patient has tried nifedipine cream  for 6 weeks.  Patient has been trying to improve her fiber ingestion to improve constipation but she continue with constipation.  She has not get any improvement for the cream.  She has significant perirectal sphincter spasm.  This is causing her significant pain.  I discussed with the patient the alternative of proceeding with chemodenervation with Botox injections to help relax the muscle to help the healing process of the fissure.  I also discussed with the patient how to increase fiber supplementation and the possible need of laxative initially to improve her constipation.  Case was discussed with Dr. Lysle Pearl who will proceed with Botox injections.          PLAN:  1.  Chemodenervation of the perirectal sphincter 2.  Continue increasing fiber in your diet 3. May use MiraLAX for severe constipation 4.  Increase water intake  Patient verbalized understanding, all questions were answered, and were agreeable with the plan outlined above.   Herbert Pun, MD  Electronically signed by Herbert Pun, MD

## 2020-03-11 DIAGNOSIS — F411 Generalized anxiety disorder: Secondary | ICD-10-CM | POA: Insufficient documentation

## 2020-03-16 ENCOUNTER — Other Ambulatory Visit: Payer: Self-pay

## 2020-03-16 ENCOUNTER — Other Ambulatory Visit
Admission: RE | Admit: 2020-03-16 | Discharge: 2020-03-16 | Disposition: A | Discharge status: Home / Self Care | Payer: 59 | Source: Ambulatory Visit | Attending: Surgery | Admitting: Surgery

## 2020-03-16 HISTORY — DX: Anxiety disorder, unspecified: F41.9

## 2020-03-16 HISTORY — DX: Nasal congestion: R09.81

## 2020-03-16 NOTE — Patient Instructions (Signed)
Your procedure is scheduled on: Friday March 20, 2020. Su procedimiento est programado para: V[facility] [place] 2021. Report to Day Surgery inside [facility] 2nd floor. Presntese a: [facility] [address] piso. To find out your arrival time please call [phone] between 1PM - 3PM on Thursday March 19, 2020. Para saber su hora de llegada por favor llame al [phone] Lyndal Pulley la 1PM - 3PM el da: Rudi Coco [address] [place] 2021.   Remember: Instructions that are not followed completely may result in serious medical risk, up to and including death,  or upon the discretion of your surgeon and anesthesiologist your surgery may need to be rescheduled.  Recuerde: Las instrucciones que no se siguen completamente Heritage manager en un riesgo de salud grave, incluyendo hasta  la [place] o a discrecin de su cirujano y Environmental health practitioner, su ciruga se puede posponer.   __X_ 1.Do not eat food after midnight the night before your procedure. No    gum chewing or hard candies. You may drink clear liquids up to 2 hours     before you are scheduled to arrive for your surgery- DO not drink clear     Liquids within 2 hours of the start of your surgery.     Clear Liquids include:    water, apple juice without pulp, clear carbohydrate drink such as    Clearfast of Gartorade, Black Coffee or Tea (Do not add anything to coffee or tea).      No coma nada despus de la medianoche de la noche anterior a su    procedimiento. No coma chicles ni caramelos duros. Puede tomar    lquidos claros hasta 2 horas antes de su hora programada de llegada al     hospital para su procedimiento. No tome lquidos claros durante el     transcurso de las 2 horas de su llegada programada al hospital para su     procedimiento, ya que esto puede llevar a que su procedimiento se    retrase o tenga que volver a Health and safety inspector.  Los lquidos claros incluyen:          - Agua o jugo de [place] sin pulpa           - Bebidas claras con carbohidratos como ClearFast o Gatorade          - Caf negro o t claro (sin leche, sin cremas, no agregue nada al caf ni al t)  No tome nada que no est en esta lista.  Los pacientes con diabetes tipo 1 y tipo 2 solo deben Agricultural engineer.  Llame a la clnica de [facility] o a la unidad de Same Day Surgery si  tiene alguna pregunta sobre estas instrucciones.              _X__ 2.Do Not Smoke or use e-cigarettes For 24 Hours Prior to Your Surgery.    Do not use any chewable tobacco products for at least 6   hours prior to surgery.    No fume ni use cigarrillos electrnicos durante las 24 horas previas    a su [place].  No use ningn producto de tabaco masticable durante   al menos 6 horas antes de la [place].     __X_ 3. No alcohol for 24 hours before or after surgery.    No tome alcohol durante las 24 horas antes ni despus de la [place].   __X__4. On the morning of surgery brush your teeth  with toothpaste and water, you may rinse your mouth with mouthwash   If you wish. DO not swallow any toothpaste or mouthwash.    En la maana de la [place], cepllese los dientes con pasta de dientes y [place], puede enjuagarse la boca con enjuague bucal.                         Si lo desea. No ingiera pasta de dientes ni enjuague bucal.   __X__ 5. Notify your doctor if there is any change in your medical condition (cold,fever, infections).    Informe a su mdico si hay algn cambio en su condicin mdica  (resfriado, fiebre, infecciones).   Do not wear jewelry, make-up, hairpins, clips or nail polish.  No use joyas, maquillajes, pinzas/ganchos para el cabello ni esmalte de uas.  Do not wear lotions, powders, or perfumes. You may wear deodorant.  No use lociones, polvos o perfumes.  Puede usar desodorante.    Do not shave 48 hours prior to surgery. Men may shave face and neck.  No se afeite 48 horas antes de la [place].  Los hombres pueden [employer] cara  y el cuello.   Do not  bring valuables to the hospital.   No lleve objetos [facility] is not responsible for any belongings or valuables.  [facility] no se hace responsable de ningn tipo de pertenencias u objetos de Geographical information systems officer.               Contacts, dentures or bridgework may not be worn into surgery.  Los lentes de [place], las dentaduras postizas o puentes no se pueden usar en la [place].   Leave your suitcase in the car. After surgery it may be brought to your room.  Deje su maleta en el auto.  Despus de la ciruga podr traerla a su habitacin.   For patients admitted to the hospital, discharge time is determined by your  treatment team.  Para los pacientes que sean ingresados al hospital, el tiempo en el cual se le  dar de alta es determinado por su equipo de [place].   Patients discharged the day of surgery will not be allowed to drive home. A los pacientes que se les da de alta el mismo da de la ciruga no se les permitir conducir a Holiday representative.    __X__ Take these medicines the morning of surgery with A SIP OF WATER:          [employer] estas medicinas la maana de la ciruga con UN SORBO DE AGUA:  1. ALPRAZolam (XANAX) 0.25 MG   2. acetaminophen (TYLENOL) 500 MG      __X__ Use CHG Soap as directed          Utilice el jabn de CHG segn lo indicado  __X__ Use inhalers on the day of surgery          Use los inhaladores el da de la ciruga        triamcinolone (NASACORT ALLERGY 24HR) 55 MCG/ACT AERO nasal inhaler   __X__ Stop Anti-inflammatories such as Ibuprofen, Aleve, Advil, naproxen aspirin and or BC powders.           Deje de tomar antiinflamatorios como Inbuprofen, Aleve, Advil, naproxen, aspirinas o polvos de BC powders.    __X__ Stop supplements until after surgery            Deje de tomar suplementos hasta despus de la [place]  __X__ Do not start any herbal supplements before your surgery.           No tome suplementos de hierbas antes de su cirugia.

## 2020-03-18 ENCOUNTER — Other Ambulatory Visit: Payer: Self-pay

## 2020-03-18 ENCOUNTER — Other Ambulatory Visit
Admission: RE | Admit: 2020-03-18 | Discharge: 2020-03-18 | Disposition: A | Discharge status: Home / Self Care | Payer: 59 | Source: Ambulatory Visit | Attending: Surgery | Admitting: Surgery

## 2020-03-18 DIAGNOSIS — Z20822 Contact with and (suspected) exposure to covid-19: Secondary | ICD-10-CM | POA: Insufficient documentation

## 2020-03-18 DIAGNOSIS — Z01812 Encounter for preprocedural laboratory examination: Secondary | ICD-10-CM | POA: Insufficient documentation

## 2020-03-18 LAB — SARS CORONAVIRUS 2 (TAT 6-24 HRS): SARS Coronavirus 2: NEGATIVE

## 2020-04-01 ENCOUNTER — Other Ambulatory Visit: Payer: Self-pay

## 2020-04-01 ENCOUNTER — Other Ambulatory Visit
Admission: RE | Admit: 2020-04-01 | Discharge: 2020-04-01 | Disposition: A | Discharge status: Home / Self Care | Payer: 59 | Source: Ambulatory Visit | Attending: Surgery | Admitting: Surgery

## 2020-04-01 DIAGNOSIS — Z20822 Contact with and (suspected) exposure to covid-19: Secondary | ICD-10-CM | POA: Insufficient documentation

## 2020-04-01 DIAGNOSIS — Z01812 Encounter for preprocedural laboratory examination: Secondary | ICD-10-CM | POA: Diagnosis present

## 2020-04-01 LAB — SARS CORONAVIRUS 2 (TAT 6-24 HRS): SARS Coronavirus 2: NEGATIVE

## 2020-04-03 ENCOUNTER — Other Ambulatory Visit: Payer: Self-pay

## 2020-04-03 ENCOUNTER — Encounter: Payer: Self-pay | Admitting: Surgery

## 2020-04-03 ENCOUNTER — Ambulatory Visit
Admission: RE | Admit: 2020-04-03 | Discharge: 2020-04-03 | Disposition: A | Discharge status: Home / Self Care | Payer: 59 | Attending: Surgery | Admitting: Surgery

## 2020-04-03 ENCOUNTER — Encounter
Admission: RE | Disposition: A | Discharge status: Home / Self Care | Payer: Self-pay | Source: Home / Self Care | Attending: Surgery

## 2020-04-03 ENCOUNTER — Ambulatory Visit: Payer: 59 | Admitting: Anesthesiology

## 2020-04-03 DIAGNOSIS — K644 Residual hemorrhoidal skin tags: Secondary | ICD-10-CM | POA: Diagnosis not present

## 2020-04-03 DIAGNOSIS — J302 Other seasonal allergic rhinitis: Secondary | ICD-10-CM | POA: Diagnosis not present

## 2020-04-03 DIAGNOSIS — Z886 Allergy status to analgesic agent status: Secondary | ICD-10-CM | POA: Insufficient documentation

## 2020-04-03 DIAGNOSIS — K601 Chronic anal fissure: Secondary | ICD-10-CM | POA: Diagnosis not present

## 2020-04-03 DIAGNOSIS — Z79899 Other long term (current) drug therapy: Secondary | ICD-10-CM | POA: Insufficient documentation

## 2020-04-03 DIAGNOSIS — K640 First degree hemorrhoids: Secondary | ICD-10-CM | POA: Diagnosis not present

## 2020-04-03 DIAGNOSIS — K5900 Constipation, unspecified: Secondary | ICD-10-CM | POA: Diagnosis not present

## 2020-04-03 DIAGNOSIS — K602 Anal fissure, unspecified: Secondary | ICD-10-CM

## 2020-04-03 HISTORY — PX: ANAL FISSURE REPAIR: SHX2312

## 2020-04-03 LAB — POCT PREGNANCY, URINE: Preg Test, Ur: NEGATIVE

## 2020-04-03 SURGERY — REPAIR, FISSURE, ANAL
Anesthesia: General | Site: Rectum

## 2020-04-03 MED ORDER — CHLORHEXIDINE GLUCONATE 0.12 % MT SOLN: 15.0000 mL | Freq: Once | OROMUCOSAL | Status: AC

## 2020-04-03 MED ORDER — CHLORHEXIDINE GLUCONATE 0.12 % MT SOLN
OROMUCOSAL | Status: AC
  Administered 2020-04-03: 15 mL via OROMUCOSAL
  Filled 2020-04-03: qty 15

## 2020-04-03 MED ORDER — FENTANYL CITRATE (PF) 100 MCG/2ML IJ SOLN
INTRAMUSCULAR | Status: AC
  Filled 2020-04-03: qty 2

## 2020-04-03 MED ORDER — GLYCOPYRROLATE 0.2 MG/ML IJ SOLN
INTRAMUSCULAR | Status: AC
  Filled 2020-04-03: qty 1

## 2020-04-03 MED ORDER — ONDANSETRON HCL 4 MG/2ML IJ SOLN
INTRAMUSCULAR | Status: DC | PRN
  Administered 2020-04-03: 4 mg via INTRAVENOUS

## 2020-04-03 MED ORDER — FAMOTIDINE 20 MG PO TABS
ORAL_TABLET | ORAL | Status: AC
  Administered 2020-04-03: 20 mg via ORAL
  Filled 2020-04-03: qty 1

## 2020-04-03 MED ORDER — DEXAMETHASONE SODIUM PHOSPHATE 10 MG/ML IJ SOLN
INTRAMUSCULAR | Status: DC | PRN
  Administered 2020-04-03: 10 mg via INTRAVENOUS

## 2020-04-03 MED ORDER — ONABOTULINUMTOXINA 100 UNITS IJ SOLR
INTRAMUSCULAR | Status: DC | PRN
  Administered 2020-04-03: 100 [IU] via INTRAMUSCULAR

## 2020-04-03 MED ORDER — LACTATED RINGERS IV SOLN: INTRAVENOUS | Status: DC

## 2020-04-03 MED ORDER — MIDAZOLAM HCL 2 MG/2ML IJ SOLN
INTRAMUSCULAR | Status: AC
  Filled 2020-04-03: qty 2

## 2020-04-03 MED ORDER — SODIUM CHLORIDE FLUSH 0.9 % IV SOLN
INTRAVENOUS | Status: AC
  Filled 2020-04-03: qty 10

## 2020-04-03 MED ORDER — ONDANSETRON HCL 4 MG/2ML IJ SOLN: 4.0000 mg | Freq: Once | INTRAMUSCULAR | Status: DC | PRN

## 2020-04-03 MED ORDER — FENTANYL CITRATE (PF) 100 MCG/2ML IJ SOLN: 25.0000 ug | INTRAMUSCULAR | Status: DC | PRN

## 2020-04-03 MED ORDER — FENTANYL CITRATE (PF) 100 MCG/2ML IJ SOLN
INTRAMUSCULAR | Status: DC | PRN
  Administered 2020-04-03 (×2): 50 ug via INTRAVENOUS

## 2020-04-03 MED ORDER — ROCURONIUM BROMIDE 100 MG/10ML IV SOLN
INTRAVENOUS | Status: DC | PRN
  Administered 2020-04-03: 70 mg via INTRAVENOUS

## 2020-04-03 MED ORDER — ORAL CARE MOUTH RINSE: 15.0000 mL | Freq: Once | OROMUCOSAL | Status: AC

## 2020-04-03 MED ORDER — SUGAMMADEX SODIUM 200 MG/2ML IV SOLN
INTRAVENOUS | Status: DC | PRN
  Administered 2020-04-03 (×2): 200 mg via INTRAVENOUS

## 2020-04-03 MED ORDER — FAMOTIDINE 20 MG PO TABS: 20.0000 mg | ORAL_TABLET | Freq: Once | ORAL | Status: AC

## 2020-04-03 MED ORDER — LIDOCAINE 5 % EX OINT
1.0000 | TOPICAL_OINTMENT | Freq: Every day | CUTANEOUS | 0 refills | Status: DC | PRN
Start: 2020-04-03 — End: 2021-08-03

## 2020-04-03 MED ORDER — PROPOFOL 10 MG/ML IV BOLUS
INTRAVENOUS | Status: DC | PRN
  Administered 2020-04-03: 200 mg via INTRAVENOUS

## 2020-04-03 SURGICAL SUPPLY — 37 items
BLADE SURG 15 STRL LF DISP TIS (BLADE) ×2 IMPLANT
BLADE SURG 15 STRL SS (BLADE) ×4
BRIEF STRETCH MATERNITY 2XLG (MISCELLANEOUS) ×4 IMPLANT
CANISTER SUCT 1200ML W/VALVE (MISCELLANEOUS) ×4 IMPLANT
COVER WAND RF STERILE (DRAPES) ×4 IMPLANT
DRAIN PENROSE 1/4X12 LTX STRL (WOUND CARE) IMPLANT
DRAPE PERI LITHO V/GYN (MISCELLANEOUS) ×4 IMPLANT
DRSG GAUZE FLUFF 36X18 (GAUZE/BANDAGES/DRESSINGS) ×4 IMPLANT
ELECT REM PT RETURN 9FT ADLT (ELECTROSURGICAL) ×4
ELECTRODE REM PT RTRN 9FT ADLT (ELECTROSURGICAL) ×2 IMPLANT
GAUZE SPONGE 4X4 12PLY STRL (GAUZE/BANDAGES/DRESSINGS) ×4 IMPLANT
GLOVE BIOGEL PI IND STRL 7.0 (GLOVE) ×2 IMPLANT
GLOVE BIOGEL PI INDICATOR 7.0 (GLOVE) ×2
GLOVE SURG SYN 6.5 ES PF (GLOVE) ×4 IMPLANT
GOWN STRL REUS W/ TWL LRG LVL3 (GOWN DISPOSABLE) ×4 IMPLANT
GOWN STRL REUS W/TWL LRG LVL3 (GOWN DISPOSABLE) ×8
KIT TURNOVER CYSTO (KITS) ×4 IMPLANT
LABEL OR SOLS (LABEL) ×4 IMPLANT
LOOP RED MAXI  1X406MM (MISCELLANEOUS)
LOOP VESSEL MAXI 1X406 RED (MISCELLANEOUS) IMPLANT
NDL SAFETY ECLIPSE 18X1.5 (NEEDLE) ×2 IMPLANT
NEEDLE HYPO 18GX1.5 SHARP (NEEDLE) ×4
NEEDLE HYPO 25GX1X1/2 BEV (NEEDLE) ×4 IMPLANT
NEEDLE HYPO 25X1 1.5 SAFETY (NEEDLE) ×4 IMPLANT
NS IRRIG 500ML POUR BTL (IV SOLUTION) ×4 IMPLANT
PACK BASIN MINOR (MISCELLANEOUS) ×4 IMPLANT
PAD PREP 24X41 OB/GYN DISP (PERSONAL CARE ITEMS) ×4 IMPLANT
SOL PREP PVP 2OZ (MISCELLANEOUS) ×4
SOLUTION PREP PVP 2OZ (MISCELLANEOUS) ×2 IMPLANT
SURGILUBE 2OZ TUBE FLIPTOP (MISCELLANEOUS) ×4 IMPLANT
SUT SILK 0 SH 30 (SUTURE) IMPLANT
SUT SILK 3 0 SH 30 (SUTURE) IMPLANT
SUT VIC AB 3-0 SH 27 (SUTURE)
SUT VIC AB 3-0 SH 27X BRD (SUTURE) IMPLANT
SYR 10ML LL (SYRINGE) ×8 IMPLANT
SYR TB 1ML 27GX1/2 LL (SYRINGE) ×8 IMPLANT
TOWEL OR 17X26 4PK STRL BLUE (TOWEL DISPOSABLE) ×4 IMPLANT

## 2020-04-03 NOTE — H&P (Signed)
HISTORY OF PRESENT ILLNESS:   [name] is a 39 y.o.female patient who comes for follow up her acute over chronic anal fissure.  Patient reported that she continue with persistent pain for the last 6 weeks. The pain is severe. The pain exacerbated by bowel movement. She does feel that his perianal area is very tight and has significant spasms that cause her pain. There has been no alleviating factor. Patient has tried nifedipine cream for 6 weeks. She has been trying to increase her fiber in her diet. She has also tried castor oil. She reported she continued feeling significantly constipated. The pain does not radiate to other part of the body.   PAST MEDICAL HISTORY:  Past Medical History:  Diagnosis Date  . Allergy  . Anxiety  . Arthritis  . Carpal tunnel syndrome  . Chronic tension headaches  . Hemorrhoids  . Seasonal allergies     PAST SURGICAL HISTORY:  Past Surgical History:  Procedure Laterality Date  . TUBAL LIGATION    MEDICATIONS:  Outpatient Encounter Medications as of 03/03/2020  Medication Sig Dispense Refill  . adapalene (DIFFERIN) 0.3 % topical gel APPLY A PEA SIZE AMOUNT TO THE FACE EVERY NIGHT as tolerated  . calcium carbonate-vitamin D3 (CALTRATE 600+D) 600 mg(1,500mg ) -200 unit tablet Take 1 tablet by mouth 2 (two) times daily with meals  . cholecalciferol (VITAMIN D3) 1000 unit capsule Take 1 capsule (1,000 Units total) by mouth once daily 360 capsule 11  . doxycycline (VIBRA-TABS) 100 MG tablet Take 100 mg by mouth once daily  . FIBER-CAPS, PSYLLIUM HUSK, ORAL Take by mouth once daily  . fluticasone propionate (FLONASE) 50 mcg/actuation nasal spray Place 2 sprays into both nostrils once daily 16 g 1  . ipratropium (ATROVENT) 21 mcg (0.03 %) nasal spray Place 2 sprays into both nostrils 2 (two) times daily 30 mL 11  . metroNIDAZOLE (METROCREAM) 0.75 % cream APPLY A SMALL AMOUNT TO AFFECTED AREA ON FACE TWICE DAILY  . multivitamin/iron/folic acid  (CENTRUM WOMEN ORAL) Take by mouth  . norethindrone (MICRONOR) 0.35 mg tablet Take 1 tablet (0.35 mg total) by mouth once daily 1 Package 11  . triamcinolone (NASACORT AQ) 55 mcg nasal spray Place 2 sprays into both nostrils once daily 1 Inhaler 12  . triamcinolone 0.1 % cream APPLY TO AFFECTED AREAS HANDS FINGERS ONCE DAILY TO TWICE DAILY AS NEEDED FOR FLARES. AVOID FACE GROIN UNDERARMS  . TURMERIC ORAL Take 500 mg by mouth  . Compound Medication Med Name: Nifedipine 0.3% plus lidocaine 2% cream. Apply to anal area two times a day and after every bowel movement. 1 each 0  . [DISCONTINUED] ASHWAGANDHA ROOT EXTRACT ORAL Take 470 mg by mouth (Patient not taking: Reported on 03/03/2020 )  . [DISCONTINUED] Compound Medication Med Name: Nifedipine 0.3% plus lidocaine 2% cream. Apply to anal area two times a day and after every bowel movement. (Patient not taking: Reported on 03/03/2020 ) 1 each 0  . [DISCONTINUED] ketoconazole (NIZORAL) 2 % cream Apply as directed QD/BID to AA's PRN rash face (Patient not taking: Reported on 03/03/2020)   No facility-administered encounter medications on file as of 03/03/2020.    ALLERGIES:  Meloxicam and Nsaids (non-steroidal anti-inflammatory drug)  SOCIAL HISTORY:  Social History   Socioeconomic History  . Marital status: Unknown  Spouse name: Not on file  . Number of children: Not on file  . Years of education: Not on file  . Highest education level: Not on file  Occupational History  .  Not on file  Tobacco Use  . Smoking status: Never Smoker  . Smokeless tobacco: Never Used  Vaping Use  . Vaping Use: Never used  Substance and Sexual Activity  . Alcohol use: Yes  Comment: 2 a 3 por semana ; una lata de [employer] hard lemonade. vino  . Drug use: Never  . Sexual activity: Yes  Partners: Male  Birth control/protection: Surgical, Other-see comments  Other Topics Concern  . Not on file  Social History Narrative  . Not on file   Social Determinants of  Health   Financial Resource Strain:  . Difficulty of Paying Living Expenses:  Food Insecurity:  . Worried About Charity fundraiser in the Last Year:  . Arboriculturist in the Last Year:  Transportation Needs:  . Film/video editor (Medical):  Marland Kitchen Lack of Transportation (Non-Medical):   FAMILY HISTORY:  Family History  Problem Relation Age of Onset  . Asthma Father  . Cancer Maternal Grandfather  . Arthritis Paternal Grandmother  . Arthritis Paternal Grandfather  . Depression Mother    GENERAL REVIEW OF SYSTEMS:   General ROS: negative for - chills, fatigue, fever, weight gain or weight loss Allergy and Immunology ROS: negative for - hives  Hematological and Lymphatic ROS: negative for - bleeding problems or bruising, negative for palpable nodes Endocrine ROS: negative for - heat or cold intolerance, hair changes Respiratory ROS: negative for - cough, shortness of breath or wheezing Cardiovascular ROS: no chest pain or palpitations GI ROS: negative for nausea, vomiting, abdominal pain, diarrhea, positive for constipation Musculoskeletal ROS: negative for - joint swelling or muscle pain Neurological ROS: negative for - confusion, syncope Dermatological ROS: negative for pruritus and rash  PHYSICAL EXAM:  Vitals:  03/03/20 0905  BP: 133/87  Pulse: 79  .  Ht:160 cm (5\' 3" ) Wt:71.2 kg (157 lb) IRJ:JOAC surface area is 1.78 meters squared. Body mass index is 27.81 kg/m.Marland Kitchen  GENERAL: Alert, active, oriented x3  HEENT: Pupils equal reactive to light. Extraocular movements are intact. Sclera clear. Palpebral conjunctiva normal red color.Pharynx clear.  NECK: Supple with no palpable mass and no adenopathy.  LUNGS: Sound clear with no rales rhonchi or wheezes.  HEART: Regular rhythm S1 and S2 without murmur.  ABDOMEN: Soft and depressible, nontender with no palpable mass, no hepatomegaly.   RECTAL: Very tight sphincter today unable to appreciate the fissure that was seen  before. No hemorrhoids. Painful on examination.  EXTREMITIES: Well-developed well-nourished symmetrical with no dependent edema.  NEUROLOGICAL: Awake alert oriented, facial expression symmetrical, moving all extremities.   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 Patient has tried nifedipine cream for 6 weeks. Patient has been trying to improve her fiber ingestion to improve constipation but she continue with constipation. She has not get any improvement for the cream. She has significant perirectal sphincter spasm. This is causing her significant pain. I discussed with the patient the alternative of proceeding with chemodenervation with Botox injections to help relax the muscle to help the healing process of the fissure. I also discussed with the patient how to increase fiber supplementation and the possible need of laxative initially to improve her constipation. Case was discussed with Dr. Lysle Pearl who will proceed with Botox injections.   PLAN:  1. Chemodenervation of the perirectal sphincter 2. Continue increasing fiber in your diet 3. May use MiraLAX for severe constipation 4. Increase water intake  Patient verbalized understanding, all questions were answered,  and were agreeable with the plan outlined above.

## 2020-04-03 NOTE — Anesthesia Preprocedure Evaluation (Addendum)
Anesthesia Evaluation  Patient identified by MRN, date of birth, ID band Patient awake    Reviewed: Allergy & Precautions, NPO status , Patient's Chart, lab work & pertinent test results  Airway Mallampati: II       Dental  (+) Teeth Intact   Pulmonary neg pulmonary ROS,    Pulmonary exam normal        Cardiovascular negative cardio ROS Normal cardiovascular exam     Neuro/Psych Anxiety  Neuromuscular disease    GI/Hepatic Neg liver ROS,   Endo/Other  negative endocrine ROS  Renal/GU negative Renal ROS  negative genitourinary   Musculoskeletal negative musculoskeletal ROS (+)   Abdominal Normal abdominal exam  (+)   Peds negative pediatric ROS (+)  Hematology negative hematology ROS (+)   Anesthesia Other Findings Past Medical History: No date: Anxiety No date: Nasal congestion  Reproductive/Obstetrics negative OB ROS                             Anesthesia Physical Anesthesia Plan  ASA: II  Anesthesia Plan: General   Post-op Pain Management:    Induction: Intravenous  PONV Risk Score and Plan:   Airway Management Planned: Oral ETT  Additional Equipment:   Intra-op Plan:   Post-operative Plan: Extubation in OR  Informed Consent: I have reviewed the patients History and Physical, chart, labs and discussed the procedure including the risks, benefits and alternatives for the proposed anesthesia with the patient or authorized representative who has indicated his/her understanding and acceptance.     Dental advisory given  Plan Discussed with: CRNA and Surgeon  Anesthesia Plan Comments:        Anesthesia Quick Evaluation

## 2020-04-03 NOTE — Discharge Instructions (Signed)
AMBULATORY SURGERY  DISCHARGE INSTRUCTIONS   1) The drugs that you were given will stay in your system until tomorrow so for the next 24 hours you should not:  A) Drive an automobile B) Make any legal decisions C) Drink any alcoholic beverage   2) You may resume regular meals tomorrow.  Today it is better to start with liquids and gradually work up to solid foods.  You may eat anything you prefer, but it is better to start with liquids, then soup and crackers, and gradually work up to solid foods.   3) Please notify your doctor immediately if you have any unusual bleeding, trouble breathing, redness and pain at the surgery site, drainage, fever, or pain not relieved by medication. 4)   5) Your post-operative visit with Dr.                                     is: Date:                        Time:    Please call to schedule your post-operative visit.  6) Additional Instructions:    Ok to resume all activities and diet as tolerated.  Lidocaine cream as needed for continued pain.  Pain should improve over next several weeks.  May experience increased passage of gas.  This will be temporary

## 2020-04-03 NOTE — Transfer of Care (Signed)
Immediate Anesthesia Transfer of Care Note  Patient: Lynn Carroll  Procedure(s) Performed: ANAL FISSURE REPAIR w/ botox (N/A Anus) EXAM UNDER ANESTHESIA (N/A Rectum)  Patient Location: PACU  Anesthesia Type:General  Level of Consciousness: awake  Airway & Oxygen Therapy: Patient Spontanous Breathing  Post-op Assessment: Report given to RN  Post vital signs: stable  Last Vitals:  Vitals Value Taken Time  BP 106/76 04/03/20 1019  Temp    Pulse 74 04/03/20 1020  Resp 15 04/03/20 1020  SpO2 98 % 04/03/20 1020  Vitals shown include unvalidated device data.  Last Pain:  Vitals:   04/03/20 0803  TempSrc: Oral  PainSc: 7          Complications: No complications documented.

## 2020-04-03 NOTE — Anesthesia Procedure Notes (Signed)
Procedure Name: Intubation Date/Time: 04/03/2020 9:49 AM Performed by: Leander Rams, CRNA Pre-anesthesia Checklist: Patient identified, Patient being monitored, Timeout performed, Emergency Drugs available and Suction available Patient Re-evaluated:Patient Re-evaluated prior to induction Oxygen Delivery Method: Circle system utilized Preoxygenation: Pre-oxygenation with 100% oxygen Induction Type: IV induction Ventilation: Mask ventilation without difficulty Laryngoscope Size: Mac, 3 and McGraph Grade View: Grade I Tube type: Oral Tube size: 7.0 mm Number of attempts: 1 Airway Equipment and Method: Stylet Placement Confirmation: ETT inserted through vocal cords under direct vision,  positive ETCO2 and breath sounds checked- equal and bilateral Secured at: 21 cm Tube secured with: Tape Dental Injury: Teeth and Oropharynx as per pre-operative assessment

## 2020-04-03 NOTE — Interval H&P Note (Signed)
History and Physical Interval Note:  04/03/2020 9:21 AM  Mullan  has presented today for surgery, with the diagnosis of Chronic Anal fissure.  The various methods of treatment have been discussed with the patient and family. After consideration of risks, benefits and other options for treatment, the patient has consented to  Procedure(s): ANAL FISSURE REPAIR w/ botox (N/A) as a surgical intervention.  The patient's history has been reviewed, patient examined, no change in status, stable for surgery.  I have reviewed the patient's chart and labs.  Questions were answered to the patient's satisfaction.     Atwell Mcdanel Lysle Pearl

## 2020-04-03 NOTE — Op Note (Signed)
Preoperative diagnosis: Anal fissure, chronic Postoperative diagnosis: same with anal skin tag and grade I internal hemorrhoids  Procedure: Exam under anesthesia, botox injection for anal fissure Anesthesia: LMA  Surgeon: Lysle Pearl  Wound Classification: Clean Contaminated  Specimen: None  Complications: None  EBL: 3 mL  Indications:  Patient is a 39 y.o. female with clinical exam concerning for an anal fissure.  Here today for formal exam under anesthesia.  See H&P for further details.  Description of procedure: The patient was taken to the operating room and placed in high lithotomy position.  LMA anesthesia was induced without any difficulty. A time-out was completed verifying correct patient, procedure, site, positioning, and implant(s) and/or special equipment prior to beginning this procedure.  External exam noted to have chronic anal fissure in posterior midline, along with skin tags. Rectal exam with speculum noted fissure to extend into mucosa. Grade I hemorrhoids, non-bleeding. No other pathology noted.  10units of botox injected on each side of fissure within the palpable internal sphincter.  Last inspection did not note any bleeding or complications.  The patient tolerated the procedure well and  taken to the postoperative care unit in stable condition.  Sponge and instrument count was correct at the end of the procedure.

## 2020-04-04 ENCOUNTER — Encounter: Payer: Self-pay | Admitting: Surgery

## 2020-04-06 NOTE — Anesthesia Postprocedure Evaluation (Signed)
Anesthesia Post Note  Patient: Wetona Viramontes  Procedure(s) Performed: ANAL FISSURE REPAIR w/ botox (N/A Anus) EXAM UNDER ANESTHESIA (N/A Rectum)  Patient location during evaluation: PACU Anesthesia Type: General Level of consciousness: awake and alert and oriented Pain management: pain level controlled Vital Signs Assessment: post-procedure vital signs reviewed and stable Respiratory status: spontaneous breathing Cardiovascular status: blood pressure returned to baseline Anesthetic complications: no   No complications documented.   Last Vitals:  Vitals:   04/03/20 1058 04/03/20 1136  BP: (!) 121/54 102/70  Pulse: 50 57  Resp: 14 16  Temp: 36.6 C   SpO2: 100% 100%    Last Pain:  Vitals:   04/03/20 1136  TempSrc:   PainSc: 2                  Jubilee Vivero

## 2020-04-14 ENCOUNTER — Ambulatory Visit: Payer: 59 | Admitting: Podiatry

## 2020-04-20 ENCOUNTER — Ambulatory Visit (INDEPENDENT_AMBULATORY_CARE_PROVIDER_SITE_OTHER): Payer: 59 | Admitting: Dermatology

## 2020-04-20 ENCOUNTER — Other Ambulatory Visit: Payer: Self-pay

## 2020-04-20 DIAGNOSIS — D229 Melanocytic nevi, unspecified: Secondary | ICD-10-CM | POA: Diagnosis not present

## 2020-04-20 DIAGNOSIS — L814 Other melanin hyperpigmentation: Secondary | ICD-10-CM

## 2020-04-20 DIAGNOSIS — L219 Seborrheic dermatitis, unspecified: Secondary | ICD-10-CM | POA: Diagnosis not present

## 2020-04-20 DIAGNOSIS — L700 Acne vulgaris: Secondary | ICD-10-CM | POA: Diagnosis not present

## 2020-04-20 DIAGNOSIS — L918 Other hypertrophic disorders of the skin: Secondary | ICD-10-CM

## 2020-04-20 MED ORDER — KETOCONAZOLE 2 % EX CREA: 1.0000 "application " | TOPICAL_CREAM | Freq: Every day | CUTANEOUS | 2 refills | Status: AC

## 2020-04-20 MED ORDER — METRONIDAZOLE 0.75 % EX CREA: TOPICAL_CREAM | Freq: Every morning | CUTANEOUS | 0 refills | Status: DC

## 2020-04-20 MED ORDER — DOXYCYCLINE MONOHYDRATE 100 MG PO CAPS: 100.0000 mg | ORAL_CAPSULE | Freq: Every morning | ORAL | 3 refills | Status: DC

## 2020-04-20 MED ORDER — ADAPALENE 0.3 % EX GEL: CUTANEOUS | 3 refills | Status: DC

## 2020-04-20 NOTE — Patient Instructions (Signed)
Take the Doxycycline 100mg  1 pill a day in the morning Metrocream to face in the morning Adapalene 0.3% gel to face in the evening Ketoconazole 2% cream to scaly areas around nose at night

## 2020-04-20 NOTE — Progress Notes (Signed)
   Follow-Up Visit   Subjective  Lynn Carroll is a 39 y.o. female who presents for the following: Acne (face, chest Doxycycline 100mg  prn,  Adapalene 0.3% gel qhs, Metronidazole cr 0.75% qd, also uses Retin A Micro 0.06% prn and Azleaic acid prn) and check moles (back).   The following portions of the chart were reviewed this encounter and updated as appropriate:      Review of Systems:  No other skin or systemic complaints except as noted in HPI or Assessment and Plan.  Objective  Well appearing patient in no apparent distress; mood and affect are within normal limits.  All skin waist up examined.  Objective  face: Scattered closed comedones forehead, chin, chest, few inflamed  Objective  Head - Anterior (Face): Mild erythema alar crease/NL fold   Assessment & Plan    Melanocytic Nevi - Tan-brown and/or pink-flesh-colored symmetric macules and papules back - Benign appearing on exam today - Observation - Call clinic for new or changing moles - Recommend daily use of broad spectrum spf 30+ sunscreen to sun-exposed areas.   Lentigines - Scattered tan macules back, arms - Discussed due to sun exposure - Benign, observe, discussed BBL, $350/tx face - Call for any changes  Acrochordons (Skin Tags) - Fleshy, skin-colored pedunculated papules, L axilla - Benign appearing.  - Observe. - If desired, they can be removed with an in office procedure that is not covered by insurance. $528 - Please call the clinic if you notice any new or changing lesions.  - Discussed Compound W for OTC option, but may not clear  Acne vulgaris face  /Rosacea  Restart Doxycycline 100mg  1 po qam with food and drink prn flares Cont Adapalene gel 0.3% qd Cont Metrocream 0.75% qd  D/c Retin A Micro 0.06% (irritates skin) D/c Azelaic acid (pt not using)  Doxycycline should be taken with food to prevent nausea. Do not lay down for 30 minutes after taking. Be cautious with sun  exposure and use good sun protection while on this medication. Pregnant women should not take this medication.   Topical retinoid medications like tretinoin/Retin-A, adapalene/Differin, tazarotene/Fabior, and Epiduo/Epiduo Forte can cause dryness and irritation when first started. Only apply a pea-sized amount to the entire affected area. Avoid applying it around the eyes, edges of mouth and creases at the nose. If you experience irritation, use a good moisturizer first and/or apply the medicine less often. If you are doing well with the medicine, you can increase how often you use it until you are applying every night. Be careful with sun protection while using this medication as it can make you sensitive to the sun. This medicine should not be used by pregnant women.    doxycycline (MONODOX) 100 MG capsule - face  metroNIDAZOLE (METROCREAM) 0.75 % cream - face  Reordered Medications Adapalene (DIFFERIN) 0.3 % gel  Seborrheic dermatitis Head - Anterior (Face)  Cont Ketoconazole 2% cr qd to aa face  ketoconazole (NIZORAL) 2 % cream - Head - Anterior (Face)  Return in about 6 months (around 10/21/2020) for Acne/Rosacea, [facility].  I, Othelia Pulling, RMA, am acting as scribe for Brendolyn Patty, MD . Documentation: I have reviewed the above documentation for accuracy and completeness, and I agree with the above.  Brendolyn Patty MD

## 2020-04-27 ENCOUNTER — Ambulatory Visit (INDEPENDENT_AMBULATORY_CARE_PROVIDER_SITE_OTHER): Payer: 59 | Admitting: Podiatry

## 2020-04-27 ENCOUNTER — Other Ambulatory Visit: Payer: Self-pay

## 2020-04-27 ENCOUNTER — Encounter: Payer: Self-pay | Admitting: Podiatry

## 2020-04-27 DIAGNOSIS — M7661 Achilles tendinitis, right leg: Secondary | ICD-10-CM | POA: Diagnosis not present

## 2020-04-27 DIAGNOSIS — M7662 Achilles tendinitis, left leg: Secondary | ICD-10-CM

## 2020-04-28 ENCOUNTER — Encounter: Payer: Self-pay | Admitting: Podiatry

## 2020-04-28 NOTE — Progress Notes (Signed)
Subjective:  Patient ID: Lynn Carroll, female    DOB: [birth year],  MRN: [id number]  Chief Complaint  Patient presents with  . Plantar Fasciitis    She comes in today for continued plantar fasciitis bilat feet    39 y.o. female presents with the above complaint.  Patient presents with bilateral posterior heel pain that has been going on for quite some time.  Patient was treated by Dr. Amalia Hailey for plantar fasciitis for which he gave a plantar fascial brace.  Patient states that this pain has continued to get worse.  She would like to discuss treatment options.  She has not seen anyone else for the posterior heel pain before seeing me.  She has tried some over-the-counter Tylenol inserts brace but has not helped.  Is painful to walk on especially in the arches.  She denies any other acute complaints.   Review of Systems: Negative except as noted in the HPI. Denies N/V/F/Ch.  Past Medical History:  Diagnosis Date  . Acne   . Anxiety   . Dermatitis   . Nasal congestion     Current Outpatient Medications:  .  acetaminophen (TYLENOL) 500 MG tablet, Take 500 mg by mouth every 6 (six) hours as needed., Disp: , Rfl:  .  Adapalene (DIFFERIN) 0.3 % gel, Apply a pea-sized amount to face every night as tolerated., Disp: 45 g, Rfl: 3 .  ALPRAZolam (XANAX) 0.25 MG tablet, Take 0.25 mg by mouth at bedtime as needed for anxiety., Disp: , Rfl:  .  Calcium Carbonate-Vitamin D 600-200 MG-UNIT TABS, Take 1 tablet by mouth daily. , Disp: , Rfl:  .  cefdinir (OMNICEF) 300 MG capsule, Take 300 mg by mouth 2 (two) times daily., Disp: , Rfl:  .  Cholecalciferol 25 MCG (1000 UT) capsule, Take by mouth. , Disp: , Rfl:  .  Collagen-Vitamin C (COLLAGEN PLUS VITAMIN C PO), Take by mouth., Disp: , Rfl:  .  doxycycline (MONODOX) 100 MG capsule, Take 1 capsule (100 mg total) by mouth in the morning. Take with food and drink, Disp: 30 capsule, Rfl: 3 .  FIBER ADULT GUMMIES PO, Take 2 tablets by mouth daily.,  Disp: , Rfl:  .  ipratropium (ATROVENT) 0.03 % nasal spray, Place 2 sprays into both nostrils 2 (two) times daily., Disp: , Rfl:  .  ketoconazole (NIZORAL) 2 % cream, Apply 1 application topically daily. Qd to scaly areas on face prn flares, Disp: 60 g, Rfl: 2 .  lidocaine (XYLOCAINE) 5 % ointment, Apply 1 application topically daily as needed. Apply pea size amount to area as needed for pain, Disp: 35.44 g, Rfl: 0 .  metroNIDAZOLE (METROCREAM) 0.75 % cream, Apply topically in the morning. qam to face for Rosacea, Disp: 45 g, Rfl: 0 .  Multiple Vitamin (MULTIVITAMIN WITH MINERALS) TABS tablet, Take 1 tablet by mouth daily., Disp: , Rfl:  .  norethindrone (MICRONOR,CAMILA,ERRIN) 0.35 MG tablet, Take 1 tablet by mouth daily. , Disp: , Rfl: 10 .  nystatin cream (MYCOSTATIN), Apply 1 application topically 2 (two) times daily., Disp: , Rfl:  .  psyllium (METAMUCIL) 58.6 % packet, Take by mouth. , Disp: , Rfl:  .  triamcinolone (NASACORT ALLERGY 24HR) 55 MCG/ACT AERO nasal inhaler, Place 2 sprays into the nose daily as needed (allergies)., Disp: , Rfl:  .  triamcinolone cream (KENALOG) 0.1 %, Apply  a small amount to affected area twice a day  Apply to affected areas BID prn  Avoid face, groin, underarms,  Disp: , Rfl:   Social History   Tobacco Use  Smoking Status Never Smoker  Smokeless Tobacco Never Used    Allergies  Allergen Reactions  . Nsaids Other (See Comments)    Chest feels tight   . Meloxicam Palpitations and Rash    Throat felt tight, stomach pain, constipation   Objective:  There were no vitals filed for this visit. There is no height or weight on file to calculate BMI. Constitutional Well developed. Well nourished.  Vascular Dorsalis pedis pulses palpable bilaterally. Posterior tibial pulses palpable bilaterally. Capillary refill normal to all digits.  No cyanosis or clubbing noted. Pedal hair growth normal.  Neurologic Normal speech. Oriented to person, place, and  time. Epicritic sensation to light touch grossly present bilaterally.  Dermatologic Nails well groomed and normal in appearance. No open wounds. No skin lesions.  Orthopedic:  Pain on palpation to the Achilles tendon insertion.  Pain with dorsiflexion of the ankle joint.  No pain with plantarflexion of the ankle joint.  No intra-articular ankle joint pain noted.   Radiographs: None Assessment:   1. Achilles tendinitis, right leg   2. Achilles tendinitis, left leg    Plan:  Patient was evaluated and treated and all questions answered.  Bilateral Achilles tendinitis -I explained patient the etiology of Achilles tendinitis and various treatment options were extensively discussed especially in the setting of pes planovalgus deformity.  Given the amount of pain that she is having I believe patient will benefit from a steroid injection in the Kager's fat pad.  I discussed with the patient that there is a high risk of rupture associated with injection near the Achilles tendon.  Patient would like to proceed with injection despite the risks associated with it. -A steroid injection was performed at bilateral Kager's fat pad using 1% plain Lidocaine and 10 mg of Kenalog. This was well tolerated. -If there is no improvement will consider a cam boot immobilization versus MRI.  Patient will continue to utilize bracing to help take the stress of the arch.  No follow-ups on file.

## 2020-06-08 ENCOUNTER — Ambulatory Visit: Payer: 59 | Admitting: Podiatry

## 2020-08-11 ENCOUNTER — Other Ambulatory Visit: Payer: Self-pay | Admitting: Dermatology

## 2020-08-11 DIAGNOSIS — L700 Acne vulgaris: Secondary | ICD-10-CM

## 2020-10-13 ENCOUNTER — Ambulatory Visit: Payer: 59 | Admitting: Dermatology

## 2020-10-13 ENCOUNTER — Other Ambulatory Visit: Payer: Self-pay

## 2020-10-13 DIAGNOSIS — L700 Acne vulgaris: Secondary | ICD-10-CM | POA: Diagnosis not present

## 2020-10-13 DIAGNOSIS — L719 Rosacea, unspecified: Secondary | ICD-10-CM

## 2020-10-13 NOTE — Progress Notes (Signed)
   Follow-Up Visit   Subjective  Lynn Carroll [place] is a 40 y.o. female who presents for the following: Follow-up (Patient here for 6 month follow-up acne/rosacea. She is about the same. She uses adapalene 0.3% gel, metronidazole 0.75% cream and Sulfacleanse. She alternates/spot treates with Veltin gel, Retin-A 0.06%,  and Azelaic acid.  She washes and moisturizes with Derald Macleod products. She tried doxycycline, but it didn't really help. She was on isotretinoin 3 years ago, which she felt really helped her acne. )  The following portions of the chart were reviewed this encounter and updated as appropriate:       Review of Systems:  No other skin or systemic complaints except as noted in HPI or Assessment and Plan.  Objective  Well appearing patient in no apparent distress; mood and affect are within normal limits.  A focused examination was performed including face, chest. Relevant physical exam findings are noted in the Assessment and Plan.  Objective  face, chest: Multiple closed comedones on chin, perioral, forehead, chest.  Objective  mid face: Mild erythema.   Assessment & Plan  Acne vulgaris face, chest  Large comedonal component, not responding to multiple topicals, oral doxycycline Discussed 2nd course of isotretinoin. Pt has had a tubal ligation.  In-office urine pregnancy test negative. Consent forms signed and patient re-registered in [facility] program. Labs ordered, patient will get at next appointment.  Continue adapalene 0.3% gel qhs for now.  Reviewed potential side effects of isotretinoin including xerosis, cheilitis, hepatitis, hyperlipidemia, and severe birth defects if taken by a pregnant woman. Reviewed reports of suicidal ideation in those with a history of depression while taking isotretinoin and reports of diagnosis of inflammatory bowl disease while taking isotretinoin as well as the lack of evidence for a causal relationship between isotretinoin,  depression and IBD. Patient advised to reach out with any questions or concerns. Patient advised not to share pills or donate blood while on treatment or for one month after completing treatment.   Topical retinoid medications like tretinoin/Retin-A, adapalene/Differin, tazarotene/Fabior, and Epiduo/Epiduo Forte can cause dryness and irritation when first started. Only apply a pea-sized amount to the entire affected area. Avoid applying it around the eyes, edges of mouth and creases at the nose. If you experience irritation, use a good moisturizer first and/or apply the medicine less often. If you are doing well with the medicine, you can increase how often you use it until you are applying every night. Be careful with sun protection while using this medication as it can make you sensitive to the sun. This medicine should not be used by pregnant women.     Other Related Procedures Comprehensive metabolic panel Lipid panel hCG, serum, qualitative  Other Related Medications Adapalene (DIFFERIN) 0.3 % gel metroNIDAZOLE (METROCREAM) 0.75 % cream  Rosacea mid face  Continue metronidazole 0.75% cream qd. Continue Sulfa-Cleanse qd.  Patient will start isotretinoin on f/u.  Return in about 30 days (around 11/12/2020) for isotretinoin start.   IJamesetta Orleans, CMA, am acting as scribe for Brendolyn Patty, MD .  Documentation: I have reviewed the above documentation for accuracy and completeness, and I agree with the above.  Brendolyn Patty MD

## 2020-11-17 ENCOUNTER — Ambulatory Visit (INDEPENDENT_AMBULATORY_CARE_PROVIDER_SITE_OTHER): Payer: 59 | Admitting: Dermatology

## 2020-11-17 ENCOUNTER — Other Ambulatory Visit: Payer: Self-pay

## 2020-11-17 DIAGNOSIS — L700 Acne vulgaris: Secondary | ICD-10-CM

## 2020-11-17 NOTE — Progress Notes (Signed)
   Follow-Up Visit   Subjective  Lynn Carroll Kaa[place] Nass is a 40 y.o. female who presents for the following: Acne (30 day follow up - Start Isotretinoin today. Tubal ligation, female latex condoms. She got her blood drawn for labs prior to today's appointment).    The following portions of the chart were reviewed this encounter and updated as appropriate:       Review of Systems:  No other skin or systemic complaints except as noted in HPI or Assessment and Plan.  Objective  Well appearing patient in no apparent distress; mood and affect are within normal limits.  A focused examination was performed including face, chest, back. Relevant physical exam findings are noted in the Assessment and Plan.  Objective  Face, chest, back: Multiple closed comedones perioral, chin, cheeks and forehead, some inflamed. Some closed comedones of chest. Few scattered comedones of back.  Weight - 150 lb   Assessment & Plan  Acne vulgaris Face, chest, back  Acne is chronic, severe, start isotretinoin today  Reviewed potential side effects of isotretinoin including xerosis, cheilitis, hepatitis, hyperlipidemia, and severe birth defects if taken by a pregnant woman. Reviewed reports of suicidal ideation in those with a history of depression while taking isotretinoin and reports of diagnosis of inflammatory bowl disease while taking isotretinoin as well as the lack of evidence for a causal relationship between isotretinoin, depression and IBD. Patient advised to reach out with any questions or concerns. Patient advised not to share pills or donate blood while on treatment or for one month after completing treatment.   Will plan to start Absorica 30 mg 1 po qd pending labs ([facility])  Advised patient to d/c all acne medications and vitamin A  While taking Isotretinoin and for 30 days after you finish the medication, do not get pregnant, do not share pills, do not donate blood. Isotretinoin is  best absorbed when taken with a fatty meal. Isotretinoin can make you sensitive to the sun. Daily careful sun protection including sunscreen SPF 30+ when outdoors is recommended.   Return in about 30 days (around 12/17/2020) for Isotretinoin.   I, Ashok Cordia, CMA, am acting as scribe for Brendolyn Patty, MD .  Documentation: I have reviewed the above documentation for accuracy and completeness, and I agree with the above.  Brendolyn Patty MD

## 2020-11-17 NOTE — Patient Instructions (Addendum)
Acne is chronic, severe, not at goal.  While taking Isotretinoin and for 30 days after you finish the medication, do not get pregnant, do not share pills, do not donate blood. Isotretinoin is best absorbed when taken with a fatty meal. Isotretinoin can make you sensitive to the sun. Daily careful sun protection including sunscreen SPF 30+ when outdoors is recommended.  Reviewed potential side effects of isotretinoin including xerosis, cheilitis, hepatitis, hyperlipidemia, and severe birth defects if taken by a pregnant woman. Reviewed reports of suicidal ideation in those with a history of depression while taking isotretinoin and reports of diagnosis of inflammatory bowl disease while taking isotretinoin as well as the lack of evidence for a causal relationship between isotretinoin, depression and IBD. Patient advised to reach out with any questions or concerns. Patient advised not to share pills or donate blood while on treatment or for one month after completing treatment.  If you have any questions or concerns for your doctor, please call our main line at [phone] and press option 4 to reach your doctor's medical assistant. If no one answers, please leave a voicemail as directed and we will return your call as soon as possible. Messages left after 4 pm will be answered the following business day.   You may also send [place] a message via [facility]. We typically respond to MyChart messages within 1-2 business days.  For prescription refills, please ask your pharmacy to contact our office. Our fax number is [phone].  If you have an urgent issue when the clinic is closed that cannot wait until the next business day, you can page your doctor at the number below.    Please note that while we do our best to be available for urgent issues outside of office hours, we are not available 24/7.   If you have an urgent issue and are unable to reach [place], you may choose to seek medical care at your doctor's  office, retail clinic, urgent care center, or emergency room.  If you have a medical emergency, please immediately call 911 or go to the emergency department.  Pager Numbers  - Dr. Nehemiah Massed: [phone]  - Dr. Laurence Ferrari: [phone]  - Dr. Nicole Kindred: [phone]  In the event of inclement weather, please call our main line at [phone] for an update on the status of any delays or closures.  Dermatology Medication Tips: Please keep the boxes that topical medications come in in order to help keep track of the instructions about where and how to use these. Pharmacies typically print the medication instructions only on the boxes and not directly on the medication tubes.   If your medication is too expensive, please contact our office at [phone] option 4 or send [place] a message through [place].   We are unable to tell what your co-pay for medications will be in advance as this is different depending on your insurance coverage. However, we may be able to find a substitute medication at lower cost or fill out paperwork to get insurance to cover a needed medication.   If a prior authorization is required to get your medication covered by your insurance company, please allow [place] 1-2 business days to complete this process.  Drug prices often vary depending on where the prescription is filled and some pharmacies may offer cheaper prices.  The website www.goodrx.com contains coupons for medications through different pharmacies. The prices here do not account for what the cost may be with help from insurance (it may be cheaper with your  insurance), but the website can give you the price if you did not use any insurance.  - You can print the associated coupon and take it with your prescription to the pharmacy.  - You may also stop by our office during regular business hours and pick up a GoodRx coupon card.  - If you need your prescription sent electronically to a different pharmacy, notify our office  through [facility] or by phone at [phone] option 4.

## 2020-11-18 ENCOUNTER — Telehealth: Payer: Self-pay

## 2020-11-18 LAB — LIPID PANEL
Chol/HDL Ratio: 4.4 ratio (ref 0.0–4.4)
Cholesterol, Total: 199 mg/dL (ref 100–199)
HDL: 45 mg/dL (ref >39)
LDL Chol Calc (NIH): 112 mg/dL — ABNORMAL HIGH (ref 0–99)
Triglycerides: 245 mg/dL — ABNORMAL HIGH (ref 0–149)
VLDL Cholesterol Cal: 42 mg/dL — ABNORMAL HIGH (ref 5–40)

## 2020-11-18 LAB — COMPREHENSIVE METABOLIC PANEL
ALT: 18 IU/L (ref 0–32)
AST: 20 IU/L (ref 0–40)
Albumin/Globulin Ratio: 2 (ref 1.2–2.2)
Albumin: 4.5 g/dL (ref 3.8–4.8)
Alkaline Phosphatase: 80 IU/L (ref 44–121)
BUN/Creatinine Ratio: 25 — ABNORMAL HIGH (ref 9–23)
BUN: 17 mg/dL (ref 6–20)
Bilirubin Total: 0.3 mg/dL (ref 0.0–1.2)
CO2: 20 mmol/L (ref 20–29)
Calcium: 9 mg/dL (ref 8.7–10.2)
Chloride: 105 mmol/L (ref 96–106)
Creatinine, Ser: 0.68 mg/dL (ref 0.57–1.00)
Globulin, Total: 2.3 g/dL (ref 1.5–4.5)
Glucose: 77 mg/dL (ref 65–99)
Potassium: 4.4 mmol/L (ref 3.5–5.2)
Sodium: 144 mmol/L (ref 134–144)
Total Protein: 6.8 g/dL (ref 6.0–8.5)
eGFR: 115 mL/min/{1.73_m2} (ref >59)

## 2020-11-18 LAB — HCG, SERUM, QUALITATIVE: hCG,Beta Subunit,Qual,Serum: NEGATIVE m[IU]/mL (ref <6)

## 2020-11-18 MED ORDER — ABSORICA 30 MG PO CAPS
30.0000 mg | ORAL_CAPSULE | Freq: Every day | ORAL | 0 refills | Status: DC
Start: 2020-11-18 — End: 2020-12-22

## 2020-11-18 NOTE — Telephone Encounter (Signed)
Left pt a message advising lab results.  Advise Triglycerides up a little but probably due to not fasting and we would recheck the later in isotretinoin course.  Advised pt I confirmed her in [facility] program and that she would have to go online and demonstrate comprehension.  Advised I sent in Absorica 30mg  1 po qd #30 0rf to [place].  IPLEDGE# [id number]./KU

## 2020-12-22 ENCOUNTER — Other Ambulatory Visit: Payer: Self-pay

## 2020-12-22 ENCOUNTER — Ambulatory Visit (INDEPENDENT_AMBULATORY_CARE_PROVIDER_SITE_OTHER): Payer: 59 | Admitting: Dermatology

## 2020-12-22 VITALS — Wt 150.0 lb

## 2020-12-22 DIAGNOSIS — L853 Xerosis cutis: Secondary | ICD-10-CM | POA: Diagnosis not present

## 2020-12-22 DIAGNOSIS — Z79899 Other long term (current) drug therapy: Secondary | ICD-10-CM | POA: Diagnosis not present

## 2020-12-22 DIAGNOSIS — L700 Acne vulgaris: Secondary | ICD-10-CM

## 2020-12-22 DIAGNOSIS — K130 Diseases of lips: Secondary | ICD-10-CM | POA: Diagnosis not present

## 2020-12-22 MED ORDER — ISOTRETINOIN 40 MG PO CAPS: 40.0000 mg | ORAL_CAPSULE | Freq: Every day | ORAL | 0 refills | Status: DC

## 2020-12-22 NOTE — Progress Notes (Signed)
   Isotretinoin Follow-Up Visit   Subjective  Lynn Carroll is a 40 y.o. female who presents for the following: Acne (Wk #4 Absorica 30mg  QD. Patient has increased dryness of the lips and body, muscle aches, and increasted arthritis pain in the knees.).  Week # 4 iPLEDGE #[id number] Tubal Ligation, Female Latex Condoms [place]   Isotretinoin F/U - 12/22/20 0900      Isotretinoin Follow Up   iPledge # [id number]    Date 12/22/20    Weight 150 lb (68 kg)    Two Forms of Birth Control Tubal Sterilization;Female Condom    Acne breakouts since last visit? Yes      Dosage   Current (To Date) Dosage (mg) 900 mg      Side Effects   Skin Dry Lips;Dry Skin    Gastrointestinal WNL    Neurological WNL    Constitutional WNL            Side effects: Dry skin, dry lips  Denies changes in night vision, shortness of breath, abdominal pain, nausea, vomiting, diarrhea, blood in stool or urine, visual changes, headaches, epistaxis, joint pain, myalgias, mood changes, depression, or suicidal ideation.   Patient is not pregnant, not seeking pregnancy, and not breastfeeding.   The following portions of the chart were reviewed this encounter and updated as appropriate: medications, allergies, medical history  Review of Systems:  No other skin or systemic complaints except as noted in HPI or Assessment and Plan.  Objective  Well appearing patient in no apparent distress; mood and affect are within normal limits.  An examination of the face, neck, chest, and back was performed and relevant findings are noted below.   Objective  face: Cystic papule on left chin; multiple large closed comedones perioral and forehead.   Assessment & Plan   Acne vulgaris face  2nd Course- week 4 Absorica  Severe and Chronic (present >1 year); currently on Isotretinoin and not to goal  Urine pregnancy test performed in office today and was negative.  Patient demonstrates comprehension and  confirms she will not get pregnant.   Increase to Absorica 40mg  take 1 po QD dsp #30 0Rf.  Pt able to manage joint pain with OTC pain relievers.  Pt confirmed in [facility] and Rx sent in to [facility].    ISOtretinoin (ABSORICA) 40 MG capsule - face   Xerosis secondary to isotretinoin therapy - Continue emollients as directed  Cheilitis secondary to isotretinoin therapy - Continue lip balm as directed, Dr. Luvenia Heller Cortibalm recommended  Long term medication management (isotretinoin) - While taking Isotretinoin and for 30 days after you finish the medication, do not get pregnant, do not share pills, do not donate blood. Isotretinoin is best absorbed when taken with a fatty meal. Isotretinoin can make you sensitive to the sun. Daily careful sun protection including sunscreen SPF 30+ when outdoors is recommended.  Follow-up in 30 days.  IJamesetta Orleans, CMA, am acting as scribe for Brendolyn Patty, MD .  Documentation: I have reviewed the above documentation for accuracy and completeness, and I agree with the above.  Brendolyn Patty MD

## 2020-12-22 NOTE — Patient Instructions (Signed)
While taking Isotretinoin and for 30 days after you finish the medication, do not get pregnant, do not share pills, do not donate blood. Isotretinoin is best absorbed when taken with a fatty meal. Isotretinoin can make you sensitive to the sun. Daily careful sun protection including sunscreen SPF 30+ when outdoors is recommended.

## 2021-01-25 ENCOUNTER — Other Ambulatory Visit: Payer: Self-pay

## 2021-01-25 ENCOUNTER — Ambulatory Visit: Payer: 59 | Admitting: Dermatology

## 2021-01-25 VITALS — Wt 150.0 lb

## 2021-01-25 DIAGNOSIS — L853 Xerosis cutis: Secondary | ICD-10-CM | POA: Diagnosis not present

## 2021-01-25 DIAGNOSIS — K130 Diseases of lips: Secondary | ICD-10-CM | POA: Diagnosis not present

## 2021-01-25 DIAGNOSIS — L918 Other hypertrophic disorders of the skin: Secondary | ICD-10-CM

## 2021-01-25 DIAGNOSIS — L700 Acne vulgaris: Secondary | ICD-10-CM

## 2021-01-25 DIAGNOSIS — Z79899 Other long term (current) drug therapy: Secondary | ICD-10-CM | POA: Diagnosis not present

## 2021-01-25 MED ORDER — ISOTRETINOIN 30 MG PO CAPS: 30.0000 mg | ORAL_CAPSULE | Freq: Two times a day (BID) | ORAL | 0 refills | Status: DC

## 2021-01-25 NOTE — Progress Notes (Signed)
Isotretinoin Follow-Up Visit   Subjective  Lynn Carroll Lynn Carroll is a 40 y.o. female who presents for the following: Acne (Wk #8 Absorica 40mg  QD. ).  Week # 8   Isotretinoin F/U - 01/25/21 1400      Isotretinoin Follow Up   iPledge # [id number]    Date 01/25/21    Weight 150 lb (68 kg)    Two Forms of Birth Control Tubal Sterilization;Female Condom    Acne breakouts since last visit? Yes      Dosage   Current (To Date) Dosage (mg) 2100 mg      Side Effects   Skin Dry Eyes;Dry Lips;Dry Skin;Nosebleed    Gastrointestinal WNL    Neurological WNL    Constitutional Muscle/joint aches           Side effects: Dry skin, dry lips  Denies changes in night vision, shortness of breath, abdominal pain, nausea, vomiting, diarrhea, blood in stool or urine, visual changes, headaches, epistaxis, joint pain, myalgias, mood changes, depression, or suicidal ideation.   Patient is not pregnant, not seeking pregnancy, and not breastfeeding.   The following portions of the chart were reviewed this encounter and updated as appropriate: medications, allergies, medical history  Review of Systems:  No other skin or systemic complaints except as noted in HPI or Assessment and Plan.  Objective  Well appearing patient in no apparent distress; mood and affect are within normal limits.  An examination of the face, neck, chest, and back was performed and relevant findings are noted below.   Objective  face: Inflammatory papules chin, perioral; multiple closed comedones face and chest.   Assessment & Plan   Acne vulgaris face  2nd Course- week #8 Absorica  Severe and Chronic (present >1 year); currently on Isotretinoin and not to goal  Natural Tears for dry eyes. Ibuprofen prn muscle pain.  Urine pregnancy test performed in office today and was negative.  Patient demonstrates comprehension and confirms she will not get pregnant.   Increase Absorica 30mg  take 1 po BID dsp #60  0Rf.  Patient confirmed in iPledge and isotretinoin sent to pharmacy.   ISOtretinoin (ABSORICA) 30 MG capsule - face   Xerosis secondary to isotretinoin therapy - Continue emollients as directed  Cheilitis secondary to isotretinoin therapy - Continue lip balm as directed, Dr. Luvenia Heller Cortibalm recommended  Long term medication management (isotretinoin) - While taking Isotretinoin and for 30 days after you finish the medication, do not get pregnant, do not share pills, do not donate blood. Isotretinoin is best absorbed when taken with a fatty meal. Isotretinoin can make you sensitive to the sun. Daily careful sun protection including sunscreen SPF 30+ when outdoors is recommended.  Acrochordons (Skin Tags) - Removal desired by patient - Fleshy, skin-colored pedunculated papule of the left axilla, R lower neck - Benign appearing.  - Patient desires removal. Reviewed that this is not covered by insurance and they will be charged a cosmetic fee for removal. Patient signed non-covered consent.  -Discussed cosmetic procedure (cryotherapy), noncovered.  $60 for 1st lesion and $15 for each additional lesion if done on the same day.  Maximum charge $350.  One touch-up treatment included no charge. - Prior to procedure, discussed risks of blister formation, small wound, skin dyspigmentation, or rare scar following cryotherapy.  PROCEDURE - Cryotherapy was performed to affected areas. The procedure was tolerated well. Wound care was reviewed with the patient. They were advised to call with any concerns. Total number of treated  acrochordons 2.   Follow-up in 30 days.  IJamesetta Orleans, CMA, am acting as scribe for Brendolyn Patty, MD .  Documentation: I have reviewed the above documentation for accuracy and completeness, and I agree with the above.  Brendolyn Patty MD

## 2021-01-25 NOTE — Patient Instructions (Addendum)
Recommend Natural Tears for dry eyes.  While taking Isotretinoin and for 30 days after you finish the medication, do not get pregnant, do not share pills, do not donate blood. Isotretinoin is best absorbed when taken with a fatty meal. Isotretinoin can make you sensitive to the sun. Daily careful sun protection including sunscreen SPF 30+ when outdoors is recommended.  Cryotherapy Aftercare  . Wash gently with soap and water everyday.   Marland Kitchen Apply Vaseline and Band-Aid daily until healed.   If you have any questions or concerns for your doctor, please call our main line at [phone] and press option 4 to reach your doctor's medical assistant. If no one answers, please leave a voicemail as directed and we will return your call as soon as possible. Messages left after 4 pm will be answered the following business day.   You may also send [place] a message via [place]. We typically respond to MyChart messages within 1-2 business days.  For prescription refills, please ask your pharmacy to contact our office. Our fax number is [phone].  If you have an urgent issue when the clinic is closed that cannot wait until the next business day, you can page your doctor at the number below.    Please note that while we do our best to be available for urgent issues outside of office hours, we are not available 24/7.   If you have an urgent issue and are unable to reach [place], you may choose to seek medical care at your doctor's office, retail clinic, urgent care center, or emergency room.  If you have a medical emergency, please immediately call 911 or go to the emergency department.  Pager Numbers  - Dr. Nehemiah Massed: [phone]  - Dr. Laurence Ferrari: [phone]  - Dr. Nicole Kindred: [phone]  In the event of inclement weather, please call our main line at [phone] for an update on the status of any delays or closures.  Dermatology Medication Tips: Please keep the boxes that topical medications come in in  order to help keep track of the instructions about where and how to use these. Pharmacies typically print the medication instructions only on the boxes and not directly on the medication tubes.   If your medication is too expensive, please contact our office at [phone] option 4 or send [place] a message through [place].   We are unable to tell what your co-pay for medications will be in advance as this is different depending on your insurance coverage. However, we may be able to find a substitute medication at lower cost or fill out paperwork to get insurance to cover a needed medication.   If a prior authorization is required to get your medication covered by your insurance company, please allow [place] 1-2 business days to complete this process.  Drug prices often vary depending on where the prescription is filled and some pharmacies may offer cheaper prices.  The website www.goodrx.com contains coupons for medications through different pharmacies. The prices here do not account for what the cost may be with help from insurance (it may be cheaper with your insurance), but the website can give you the price if you did not use any insurance.  - You can print the associated coupon and take it with your prescription to the pharmacy.  - You may also stop by our office during regular business hours and pick up a GoodRx coupon card.  - If you need your prescription sent electronically to a different pharmacy, notify our office through [facility] MyChart or by phone at [phone] option 4.

## 2021-03-02 ENCOUNTER — Ambulatory Visit: Payer: 59 | Admitting: Dermatology

## 2021-03-02 ENCOUNTER — Other Ambulatory Visit: Payer: Self-pay

## 2021-03-02 VITALS — Wt 150.0 lb

## 2021-03-02 DIAGNOSIS — Z79899 Other long term (current) drug therapy: Secondary | ICD-10-CM

## 2021-03-02 DIAGNOSIS — K130 Diseases of lips: Secondary | ICD-10-CM

## 2021-03-02 DIAGNOSIS — L853 Xerosis cutis: Secondary | ICD-10-CM

## 2021-03-02 DIAGNOSIS — L700 Acne vulgaris: Secondary | ICD-10-CM | POA: Diagnosis not present

## 2021-03-02 MED ORDER — ISOTRETINOIN 40 MG PO CAPS: 40.0000 mg | ORAL_CAPSULE | Freq: Two times a day (BID) | ORAL | 0 refills | Status: AC

## 2021-03-02 NOTE — Patient Instructions (Addendum)
While taking Isotretinoin and for 30 days after you finish the medication, do not get pregnant, do not share pills, do not donate blood. Isotretinoin is best absorbed when taken with a fatty meal. Isotretinoin can make you sensitive to the sun. Daily careful sun protection including sunscreen SPF 30+ when outdoors is recommended.   Start iboprofen 400mg  every 6 hrs as needed for muscle pain.   If you have any questions or concerns for your doctor, please call our main line at [phone] and press option 4 to reach your doctor's medical assistant. If no one answers, please leave a voicemail as directed and we will return your call as soon as possible. Messages left after 4 pm will be answered the following business day.   You may also send [place] a message via [place]. We typically respond to MyChart messages within 1-2 business days.  For prescription refills, please ask your pharmacy to contact our office. Our fax number is [phone].  If you have an urgent issue when the clinic is closed that cannot wait until the next business day, you can page your doctor at the number below.    Please note that while we do our best to be available for urgent issues outside of office hours, we are not available 24/7.   If you have an urgent issue and are unable to reach [place], you may choose to seek medical care at your doctor's office, retail clinic, urgent care center, or emergency room.  If you have a medical emergency, please immediately call 911 or go to the emergency department.  Pager Numbers  - Dr. Nehemiah Massed: [phone]  - Dr. Laurence Ferrari: [phone]  - Dr. Nicole Kindred: [phone]  In the event of inclement weather, please call our main line at [phone] for an update on the status of any delays or closures.  Dermatology Medication Tips: Please keep the boxes that topical medications come in in order to help keep track of the instructions about where and how to use these. Pharmacies typically  print the medication instructions only on the boxes and not directly on the medication tubes.   If your medication is too expensive, please contact our office at [phone] option 4 or send [place] a message through [place].   We are unable to tell what your co-pay for medications will be in advance as this is different depending on your insurance coverage. However, we may be able to find a substitute medication at lower cost or fill out paperwork to get insurance to cover a needed medication.   If a prior authorization is required to get your medication covered by your insurance company, please allow [place] 1-2 business days to complete this process.  Drug prices often vary depending on where the prescription is filled and some pharmacies may offer cheaper prices.  The website www.goodrx.com contains coupons for medications through different pharmacies. The prices here do not account for what the cost may be with help from insurance (it may be cheaper with your insurance), but the website can give you the price if you did not use any insurance.  - You can print the associated coupon and take it with your prescription to the pharmacy.  - You may also stop by our office during regular business hours and pick up a GoodRx coupon card.  - If you need your prescription sent electronically to a different pharmacy, notify our office through [facility] or by phone at [phone] option 4.

## 2021-03-02 NOTE — Progress Notes (Signed)
   Isotretinoin Follow-Up Visit   Subjective  Lynn Carroll is a 40 y.o. female who presents for the following: Acne (Face. Wk #12 Absorica 30mg  2 po QD.).  Week # 12   Isotretinoin F/U - 03/02/21 1400       Isotretinoin Follow Up   iPledge # [id number]    Date 03/02/21    Weight 150 lb (68 kg)    Two Forms of Birth Control Tubal Sterilization;Female Condom    Acne breakouts since last visit? Yes      Dosage   Current (To Date) Dosage (mg) 3900 mg      Side Effects   Skin Nosebleed;Dry Nose;Chapped Lips    Gastrointestinal Abdominal Pain    Neurological Depression    Constitutional Muscle/joint aches             Side effects: Dry skin, dry lips  Denies changes in night vision, shortness of breath, abdominal pain, nausea, vomiting, diarrhea, blood in stool or urine, visual changes, headaches, epistaxis, joint pain, myalgias, mood changes, depression, or suicidal ideation.   Patient is not pregnant, not seeking pregnancy, and not breastfeeding.   The following portions of the chart were reviewed this encounter and updated as appropriate: medications, allergies, medical history  Review of Systems:  No other skin or systemic complaints except as noted in HPI or Assessment and Plan.  Objective  Well appearing patient in no apparent distress; mood and affect are within normal limits.  An examination of the face, neck, chest, and back was performed and relevant findings are noted below.   face Scattered large closed comedones of the perioral, chin.   Assessment & Plan   Acne vulgaris face  Wk #12, 2nd course isotretinoin Severe; On Isotretinoin -  requiring FDA mandated monthly evaluations and laboratory monitoring; Chronic and Persistent; Not to Goal  iPLEDGE #[id number]  [place] Tubal Sterilization; Female Condoms  Urine pregnancy test performed in office today and was negative.  Patient demonstrates comprehension and confirms she will not get  pregnant.   Patient confirmed in iPledge and isotretinoin sent to pharmacy.  Increase Absorica 40mg  take 2 po QD dsp #60 0Rf.  Labs on follow-up.  Total mg to date = 3900 mg Total mg/kg = 57.35mg /kg   Start iboprofen 400mg  PO with food q 6 hrs prn muscle pain.   ISOtretinoin (ABSORICA) 40 MG capsule - face Take 1 capsule (40 mg total) by mouth 2 (two) times daily.   Xerosis secondary to isotretinoin therapy - Continue emollients as directed  Cheilitis secondary to isotretinoin therapy - Continue lip balm as directed, Dr. Luvenia Heller Cortibalm recommended  Long term medication management (isotretinoin) - While taking Isotretinoin and for 30 days after you finish the medication, do not get pregnant, do not share pills, do not donate blood. Isotretinoin is best absorbed when taken with a fatty meal. Isotretinoin can make you sensitive to the sun. Daily careful sun protection including sunscreen SPF 30+ when outdoors is recommended.  Follow-up in 30 days.  IJamesetta Orleans, CMA, am acting as scribe for Brendolyn Patty, MD . Documentation: I have reviewed the above documentation for accuracy and completeness, and I agree with the above.  Brendolyn Patty MD

## 2021-04-05 ENCOUNTER — Other Ambulatory Visit: Payer: Self-pay

## 2021-04-05 ENCOUNTER — Ambulatory Visit (INDEPENDENT_AMBULATORY_CARE_PROVIDER_SITE_OTHER): Payer: 59 | Admitting: Dermatology

## 2021-04-05 VITALS — Wt 150.0 lb

## 2021-04-05 DIAGNOSIS — K130 Diseases of lips: Secondary | ICD-10-CM | POA: Diagnosis not present

## 2021-04-05 DIAGNOSIS — L853 Xerosis cutis: Secondary | ICD-10-CM | POA: Diagnosis not present

## 2021-04-05 DIAGNOSIS — L700 Acne vulgaris: Secondary | ICD-10-CM

## 2021-04-05 DIAGNOSIS — Z79899 Other long term (current) drug therapy: Secondary | ICD-10-CM

## 2021-04-05 NOTE — Progress Notes (Signed)
   Isotretinoin Follow-Up Visit   Subjective  Lynn Carroll is a 40 y.o. female who presents for the following: Acne (Patient here today for acne follow up. She reports some new break outs since last visit. She is currently using absorica 80 mg daily. She reports she is having some trouble with dry skin, lips, eyes, nose, depression, headache, muscle/joint aches and blurred vision since taking medication. ). Side effects are about the same as last month, not worse.  They are manageable.  Week # 16   Isotretinoin F/U - 04/05/21 0900       Isotretinoin Follow Up   iPledge # [id number]    Date 04/05/21    Weight 150 lb (68 kg)    Two Forms of Birth Control Tubal Sterilization;Female Condom    Acne breakouts since last visit? Yes      Side Effects   Skin Dry Skin;Dry Lips;Dry Eyes;Nosebleed    Gastrointestinal WNL    Neurological Blurred Vision;Depression;Headache    Constitutional Muscle/joint aches             Side effects: Dry skin, dry lips  Denies changes in night vision, shortness of breath, abdominal pain, nausea, vomiting, diarrhea, blood in stool or urine, visual changes, headaches, epistaxis, joint pain, myalgias, mood changes, depression, or suicidal ideation.   Patient is not pregnant, not seeking pregnancy, and not breastfeeding.   The following portions of the chart were reviewed this encounter and updated as appropriate: medications, allergies, medical history  Review of Systems:  No other skin or systemic complaints except as noted in HPI or Assessment and Plan.  Objective  Well appearing patient in no apparent distress; mood and affect are within normal limits.  An examination of the face, neck, chest, and back was performed and relevant findings are noted below.   face and chest Small inflammatory papules on right cheek and right chin, scattered violaceous macules on chin and cheeks, some comedones on chest    Assessment & Plan   Acne  vulgaris face and chest  Severe; On Isotretinoin -  requiring FDA mandated monthly evaluations and laboratory monitoring; Chronic and Persistent; Not to Goal   iPLEDGE [id number] [place] Tubal Sterilization; Female Condoms   Pending labs will send Absorica to [place] '40mg'$  take 2 po QD dsp #60 0Rf.   Total mg to date = 3900 mg Total mg/kg = 92.6 mg / kg   Comprehensive metabolic panel - face and chest  Lipid panel - face and chest  hCG, serum, qualitative - face and chest   Xerosis secondary to isotretinoin therapy - Continue emollients as directed  Cheilitis secondary to isotretinoin therapy - Continue lip balm as directed, Dr. Luvenia Heller Cortibalm recommended  Long term medication management (isotretinoin) - While taking Isotretinoin and for 30 days after you finish the medication, do not get pregnant, do not share pills, do not donate blood. Isotretinoin is best absorbed when taken with a fatty meal. Isotretinoin can make you sensitive to the sun. Daily careful sun protection including sunscreen SPF 30+ when outdoors is recommended.  Follow-up in 30 days. I, Ruthell Rummage, CMA, am acting as scribe for Brendolyn Patty, MD.  Documentation: I have reviewed the above documentation for accuracy and completeness, and I agree with the above.  Brendolyn Patty MD

## 2021-04-05 NOTE — Patient Instructions (Signed)
While taking Isotretinoin and for 30 days after you finish the medication, do not get pregnant, do not share pills, do not donate blood. Isotretinoin is best absorbed when taken with a fatty meal. Isotretinoin can make you sensitive to the sun. Daily careful sun protection including sunscreen SPF 30+ when outdoors is recommended.    If you have any questions or concerns for your doctor, please call our main line at [phone] and press option 4 to reach your doctor's medical assistant. If no one answers, please leave a voicemail as directed and we will return your call as soon as possible. Messages left after 4 pm will be answered the following business day.   You may also send [place] a message via [place]. We typically respond to MyChart messages within 1-2 business days.  For prescription refills, please ask your pharmacy to contact our office. Our fax number is [phone].  If you have an urgent issue when the clinic is closed that cannot wait until the next business day, you can page your doctor at the number below.    Please note that while we do our best to be available for urgent issues outside of office hours, we are not available 24/7.   If you have an urgent issue and are unable to reach [place], you may choose to seek medical care at your doctor's office, retail clinic, urgent care center, or emergency room.  If you have a medical emergency, please immediately call 911 or go to the emergency department.  Pager Numbers  - Dr. Nehemiah Massed: [phone]  - Dr. Laurence Ferrari: [phone]  - Dr. Nicole Kindred: [phone]  In the event of inclement weather, please call our main line at [phone] for an update on the status of any delays or closures.  Dermatology Medication Tips: Please keep the boxes that topical medications come in in order to help keep track of the instructions about where and how to use these. Pharmacies typically print the medication instructions only on the boxes and not  directly on the medication tubes.   If your medication is too expensive, please contact our office at [phone] option 4 or send [place] a message through [place].   We are unable to tell what your co-pay for medications will be in advance as this is different depending on your insurance coverage. However, we may be able to find a substitute medication at lower cost or fill out paperwork to get insurance to cover a needed medication.   If a prior authorization is required to get your medication covered by your insurance company, please allow [place] 1-2 business days to complete this process.  Drug prices often vary depending on where the prescription is filled and some pharmacies may offer cheaper prices.  The website www.goodrx.com contains coupons for medications through different pharmacies. The prices here do not account for what the cost may be with help from insurance (it may be cheaper with your insurance), but the website can give you the price if you did not use any insurance.  - You can print the associated coupon and take it with your prescription to the pharmacy.  - You may also stop by our office during regular business hours and pick up a GoodRx coupon card.  - If you need your prescription sent electronically to a different pharmacy, notify our office through [facility] or by phone at [phone] option 4.

## 2021-04-06 ENCOUNTER — Other Ambulatory Visit: Payer: Self-pay

## 2021-04-06 ENCOUNTER — Telehealth: Payer: Self-pay

## 2021-04-06 LAB — COMPREHENSIVE METABOLIC PANEL
ALT: 17 IU/L (ref 0–32)
AST: 21 IU/L (ref 0–40)
Albumin/Globulin Ratio: 2.4 — ABNORMAL HIGH (ref 1.2–2.2)
Albumin: 4.6 g/dL (ref 3.8–4.8)
Alkaline Phosphatase: 76 IU/L (ref 44–121)
BUN/Creatinine Ratio: 16 (ref 9–23)
BUN: 12 mg/dL (ref 6–20)
Bilirubin Total: 0.2 mg/dL (ref 0.0–1.2)
CO2: 25 mmol/L (ref 20–29)
Calcium: 9.2 mg/dL (ref 8.7–10.2)
Chloride: 103 mmol/L (ref 96–106)
Creatinine, Ser: 0.77 mg/dL (ref 0.57–1.00)
Globulin, Total: 1.9 g/dL (ref 1.5–4.5)
Glucose: 80 mg/dL (ref 65–99)
Potassium: 4.1 mmol/L (ref 3.5–5.2)
Sodium: 140 mmol/L (ref 134–144)
Total Protein: 6.5 g/dL (ref 6.0–8.5)
eGFR: 101 mL/min/{1.73_m2} (ref >59)

## 2021-04-06 LAB — LIPID PANEL
Chol/HDL Ratio: 5.4 ratio — ABNORMAL HIGH (ref 0.0–4.4)
Cholesterol, Total: 226 mg/dL — ABNORMAL HIGH (ref 100–199)
HDL: 42 mg/dL (ref >39)
LDL Chol Calc (NIH): 137 mg/dL — ABNORMAL HIGH (ref 0–99)
Triglycerides: 261 mg/dL — ABNORMAL HIGH (ref 0–149)
VLDL Cholesterol Cal: 47 mg/dL — ABNORMAL HIGH (ref 5–40)

## 2021-04-06 LAB — HCG, SERUM, QUALITATIVE: hCG,Beta Subunit,Qual,Serum: NEGATIVE m[IU]/mL (ref <6)

## 2021-04-06 MED ORDER — ISOTRETINOIN 40 MG PO CAPS
40.0000 mg | ORAL_CAPSULE | Freq: Two times a day (BID) | ORAL | 0 refills | Status: DC
Start: 2021-04-06 — End: 2021-10-01

## 2021-04-06 NOTE — Telephone Encounter (Signed)
Advised patient of lab results, cholesterol/Tgs are elevated but stable, pregnancy negative. Patient confirmed in [place] program and Rx sent to [facility]. Patient to demonstrate comprehension.

## 2021-04-06 NOTE — Telephone Encounter (Signed)
-----   Message from Brendolyn Patty, MD sent at 04/06/2021  9:17 AM EDT ----- Labs okay, Chol/Tgs are elevated but stable, preg negative, continue Isotretinoin as per note - please call patient

## 2021-04-30 ENCOUNTER — Other Ambulatory Visit (HOSPITAL_COMMUNITY): Payer: Self-pay | Admitting: Student

## 2021-04-30 ENCOUNTER — Other Ambulatory Visit: Payer: Self-pay | Admitting: Student

## 2021-04-30 DIAGNOSIS — M5442 Lumbago with sciatica, left side: Secondary | ICD-10-CM

## 2021-04-30 DIAGNOSIS — M1712 Unilateral primary osteoarthritis, left knee: Secondary | ICD-10-CM

## 2021-04-30 DIAGNOSIS — M25562 Pain in left knee: Secondary | ICD-10-CM

## 2021-04-30 DIAGNOSIS — M5416 Radiculopathy, lumbar region: Secondary | ICD-10-CM

## 2021-05-11 ENCOUNTER — Ambulatory Visit
Admission: RE | Admit: 2021-05-11 | Discharge: 2021-05-11 | Disposition: A | Discharge status: Home / Self Care | Payer: 59 | Source: Ambulatory Visit | Attending: Student | Admitting: Student

## 2021-05-11 ENCOUNTER — Ambulatory Visit: Payer: 59 | Admitting: Dermatology

## 2021-05-11 ENCOUNTER — Other Ambulatory Visit: Payer: Self-pay

## 2021-05-11 VITALS — Wt 151.0 lb

## 2021-05-11 DIAGNOSIS — G8929 Other chronic pain: Secondary | ICD-10-CM

## 2021-05-11 DIAGNOSIS — M5416 Radiculopathy, lumbar region: Secondary | ICD-10-CM

## 2021-05-11 DIAGNOSIS — M5441 Lumbago with sciatica, right side: Secondary | ICD-10-CM

## 2021-05-11 DIAGNOSIS — M1712 Unilateral primary osteoarthritis, left knee: Secondary | ICD-10-CM | POA: Insufficient documentation

## 2021-05-11 DIAGNOSIS — L700 Acne vulgaris: Secondary | ICD-10-CM | POA: Diagnosis not present

## 2021-05-11 DIAGNOSIS — Z79899 Other long term (current) drug therapy: Secondary | ICD-10-CM

## 2021-05-11 DIAGNOSIS — L853 Xerosis cutis: Secondary | ICD-10-CM

## 2021-05-11 DIAGNOSIS — K130 Diseases of lips: Secondary | ICD-10-CM | POA: Diagnosis not present

## 2021-05-11 DIAGNOSIS — M25562 Pain in left knee: Secondary | ICD-10-CM | POA: Diagnosis present

## 2021-05-11 DIAGNOSIS — M5442 Lumbago with sciatica, left side: Secondary | ICD-10-CM | POA: Diagnosis present

## 2021-05-11 IMAGING — MR MR KNEE*L* W/O CM
7 series · 40 of 40 positions shown · non-contrast
Comparison: None.

CLINICAL DATA: Chronic left knee pain.  No prior surgery.

EXAM:
MRI OF THE LEFT KNEE WITHOUT CONTRAST
TECHNIQUE: Multiplanar, multisequence MR imaging of the knee was performed. No
intravenous contrast was administered.

[Series 8: T2 fat-sat · axial · left · 4.0mm · 0.50mm/px · z∈[-67,+57]mm · 6 of 26 slices shown (1 of 3)]
[im 1/26]
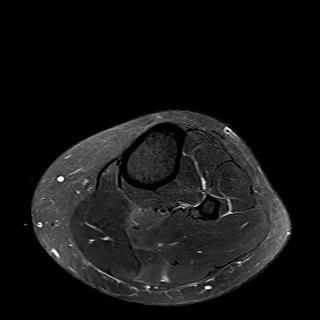
[im 6/26]
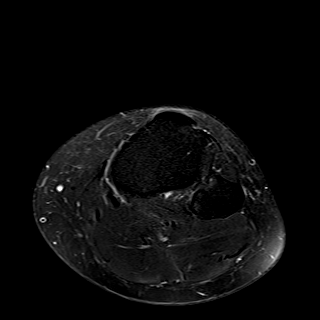
[im 11/26]
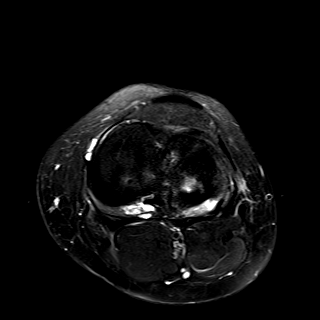
[im 16/26]
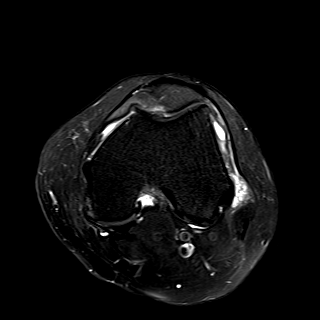
[im 21/26]
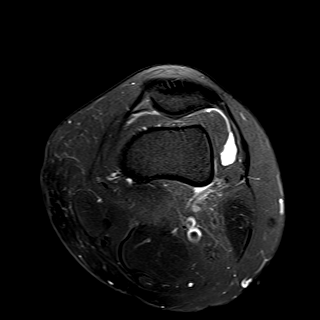
[im 26/26]
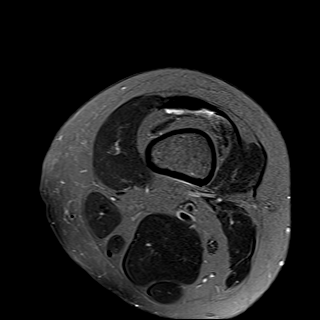

[Series 9: T1 · coronal · left · 4.0mm · 0.47mm/px · 6 of 28 slices shown]
[im 1/28]
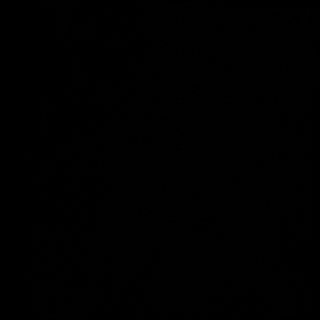
[im 6/28]
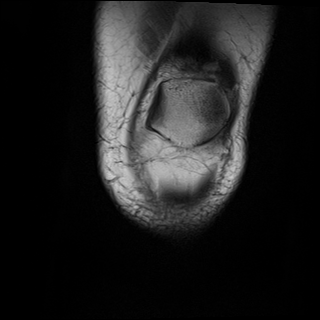
[im 11/28]
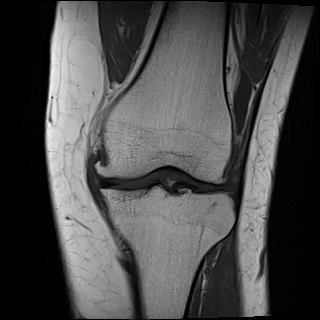
[im 17/28]
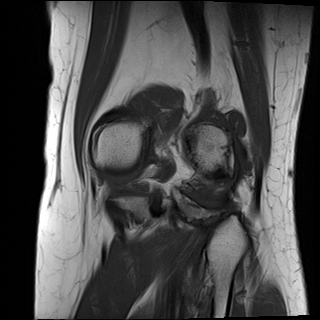
[im 22/28]
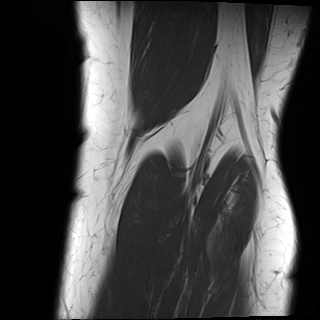
[im 28/28]
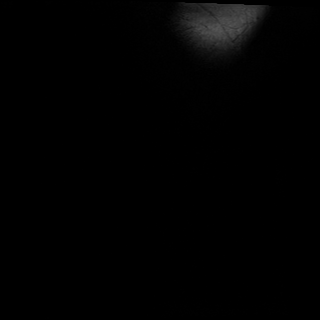

[Series 10: T2 fat-sat · coronal · left · 4.0mm · 0.47mm/px · 6 of 28 slices shown (2 of 3)]
[im 1/28]
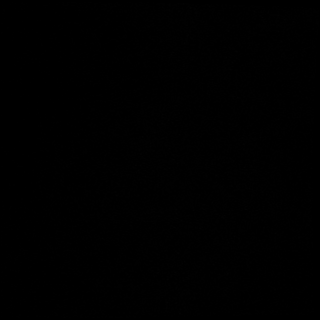
[im 6/28]
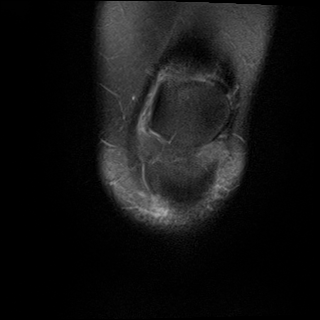
[im 11/28]
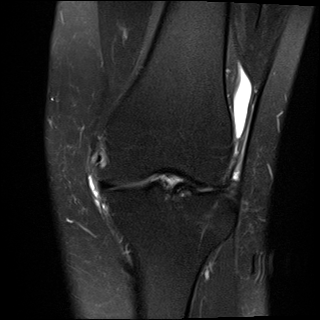
[im 17/28]
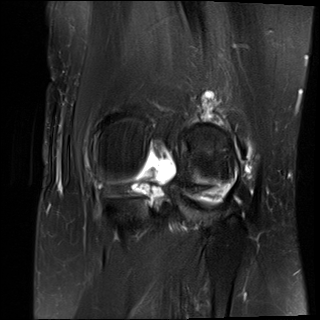
[im 22/28]
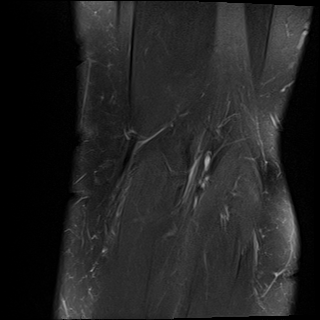
[im 28/28]
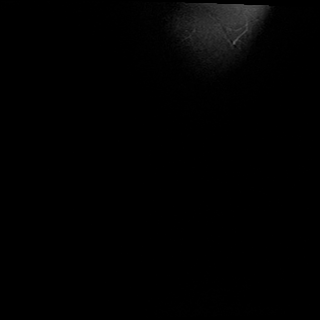

[Series 11: PD fat-sat · coronal · left · 4.0mm · 0.59mm/px · 6 of 28 slices shown (1 of 2)]
[im 1/28]
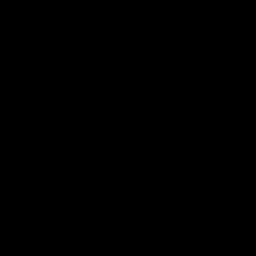
[im 6/28]
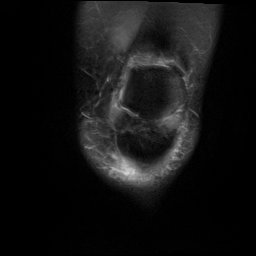
[im 11/28]
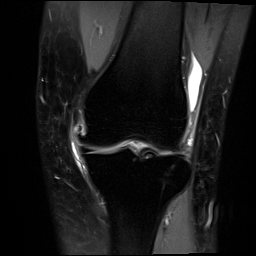
[im 17/28]
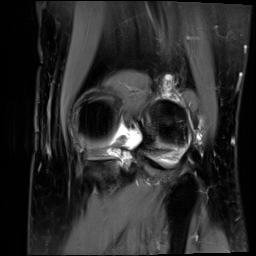
[im 22/28]
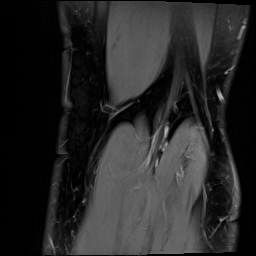
[im 28/28]
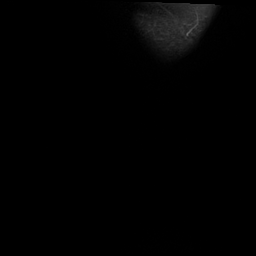

[Series 12: PD fat-sat · sagittal · left · 3.0mm · 0.47mm/px · 7 of 31 slices shown (2 of 2)]
[im 1/31]
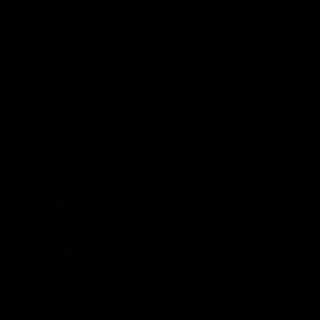
[im 6/31]
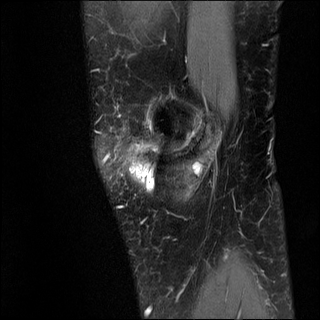
[im 11/31]
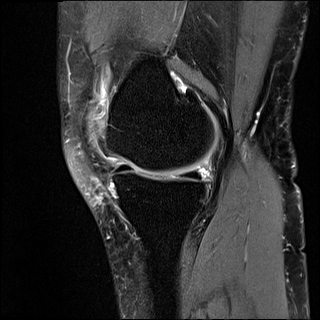
[im 16/31]
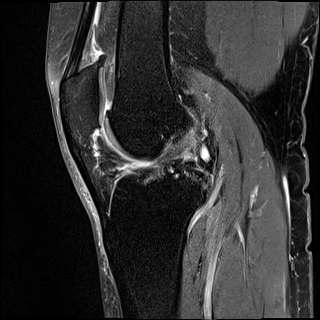
[im 21/31]
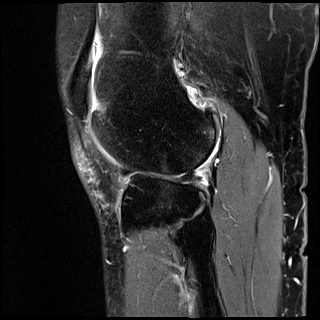
[im 26/31]
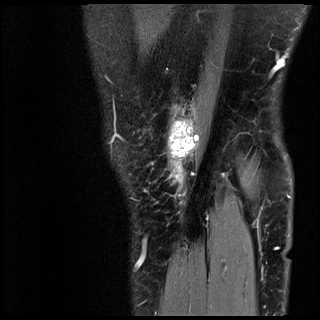
[im 31/31]
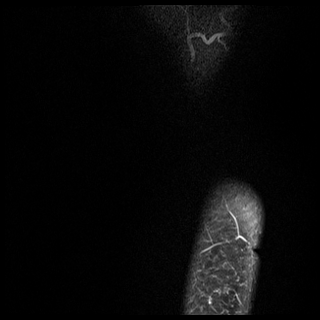

[Series 13: T2 fat-sat · sagittal · left · 3.0mm · 0.47mm/px · 7 of 34 slices shown (3 of 3)]
[im 1/34]
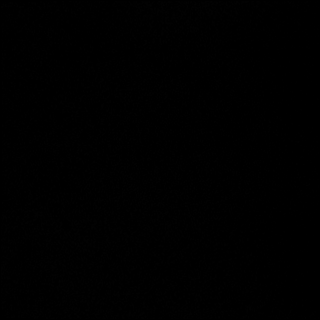
[im 6/34]
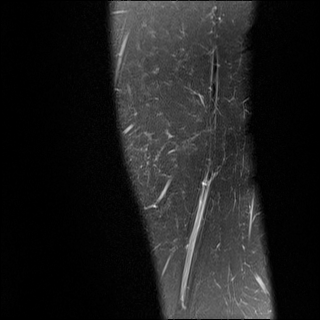
[im 12/34]
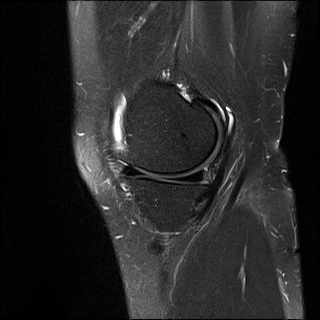
[im 17/34]
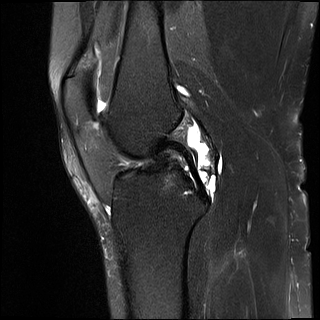
[im 23/34]
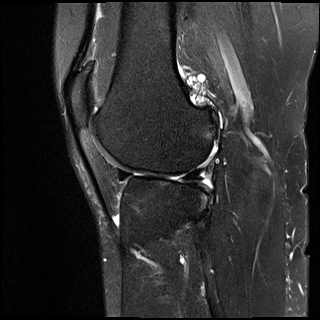
[im 28/34]
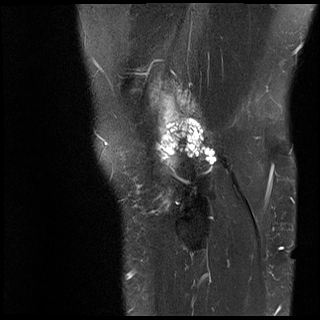
[im 34/34]
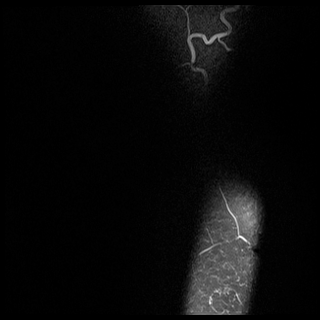

[Series 14: PD · oblique · left · 2.0mm · 0.47mm/px · 2 of 10 slices shown]
[im 1/10]
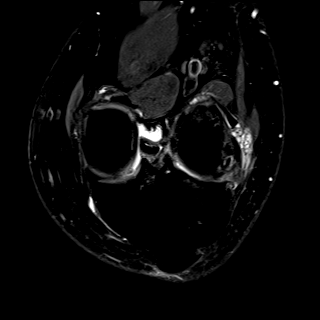
[im 10/10]
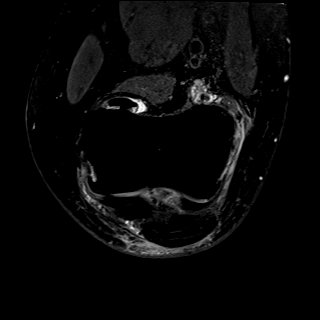

[40 of 40 positions shown; findings below may reference images not displayed]

FINDINGS: MENISCI

Medial meniscus:  Intact.

Lateral meniscus: Severely degenerated body with complex tear.
Adjacent 3.7 x 0.6 x 2.1 cm parameniscal cyst blunting of the
posterior horn free edge consistent with radial tear.

LIGAMENTS

Cruciates:  Intact ACL and PCL.

Collaterals: Medial collateral ligament is intact. Lateral
collateral ligament complex is intact.

CARTILAGE

Patellofemoral:  No chondral defect.

Medial: Early degeneration and superficial fissuring over the
central weight-bearing medial femoral condyle and medial tibial
plateau.

Lateral: Scattered areas of high-grade partial and full-thickness
cartilage loss over the lateral femoral condyle and lateral tibial
plateau.

Joint:  Small joint effusion.  Normal Hoffa's fat.

Popliteal Fossa:  Trace Baker cyst.  Intact popliteus tendon.

Extensor Mechanism: Intact quadriceps tendon and patellar tendon.
Intact medial and lateral patellar retinaculum. Intact MPFL.

Bones: No acute fracture or dislocation. No suspicious bone lesion.
Tricompartmental marginal osteophytes.

Other: 0.9 x 1.8 x 1.2 cm multiloculated ganglion cyst near the
origin of the lateral gastrocnemius tendon.
IMPRESSION: 1. Severely degenerated lateral meniscus body with complex tear and
adjacent 3.7 cm parameniscal cyst. Additional radial tear of the
posterior horn.
2. Tricompartmental osteoarthritis, moderate in the lateral
compartment.
3. Small joint effusion. Trace Baker cyst.

## 2021-05-11 IMAGING — MR MR LUMBAR SPINE W/O CM
5 series · 30 of 48 positions shown · non-contrast
Comparison: None available.

CLINICAL DATA: Initial evaluation for central and left greater than
right lower back pain, with left greater than right hip pain,
buttock pain, and leg pain, occasional foot numbness. History of
falls.

EXAM:
MRI LUMBAR SPINE WITHOUT CONTRAST
TECHNIQUE: Multiplanar, multisequence MR imaging of the lumbar spine was
performed. No intravenous contrast was administered.

[Series 5: T2 · sagittal · 4.0mm · 0.81mm/px · 6 of 17 slices shown (1 of 2)]
[im 1/17]
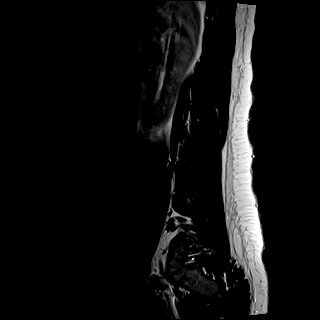
[im 4/17]
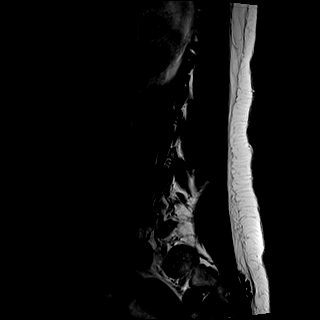
[im 7/17]
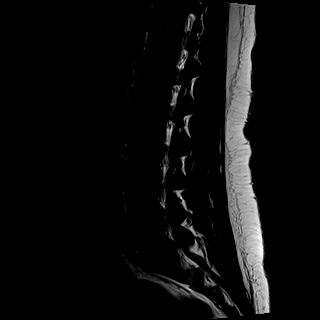
[im 10/17]
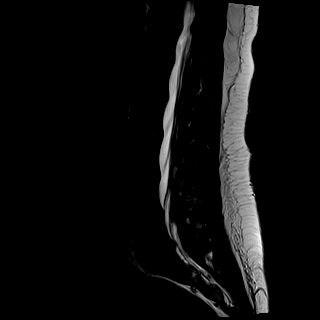
[im 13/17]
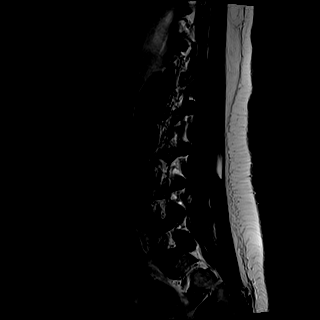
[im 17/17]
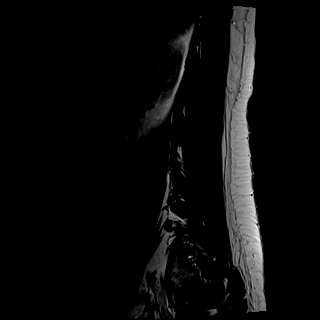

[Series 6: T1 · sagittal · 4.0mm · 0.81mm/px · 7 of 17 slices shown (1 of 2)]
[im 1/17]
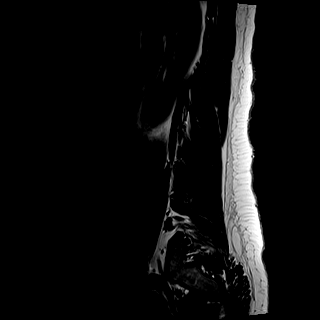
[im 3/17]
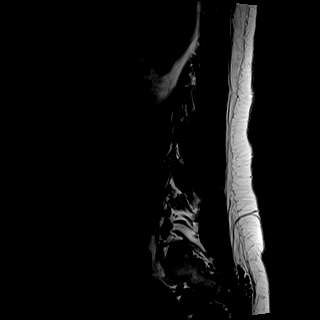
[im 6/17]
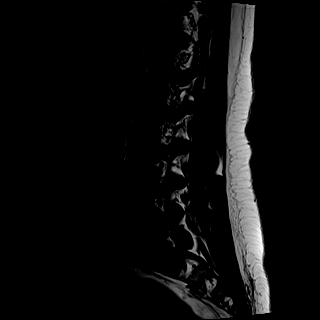
[im 9/17]
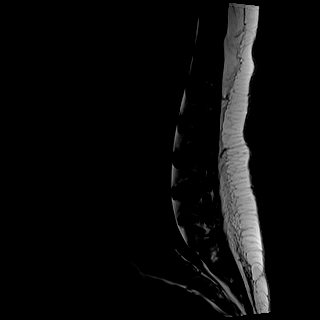
[im 11/17]
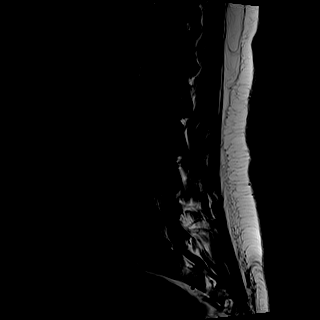
[im 14/17]
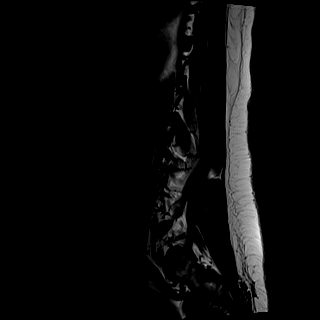
[im 17/17]
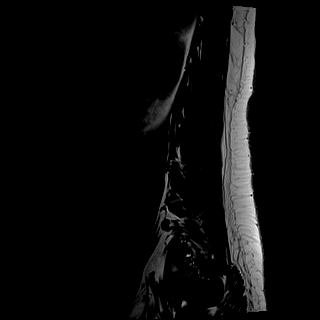

[Series 7: STIR · sagittal · 4.0mm · 0.41mm/px · 1 of 17 slices shown]
[im 1/17]
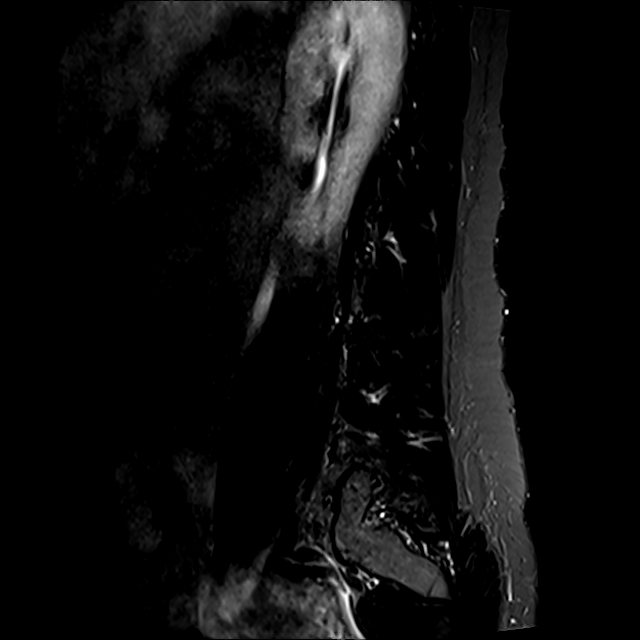

[Series 8: T2 · axial · 4.0mm · 0.78mm/px · z∈[-52,+147]mm · 8 of 34 slices shown (2 of 2)]
[im 1/34]
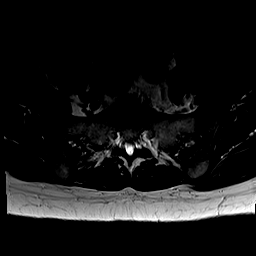
[im 6/34]
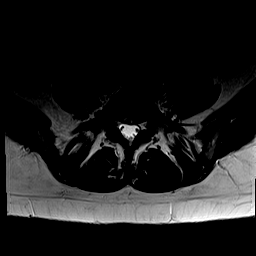
[im 11/34]
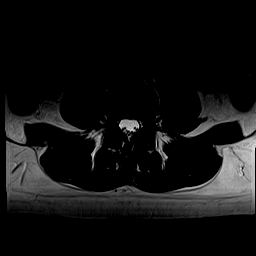
[im 16/34]
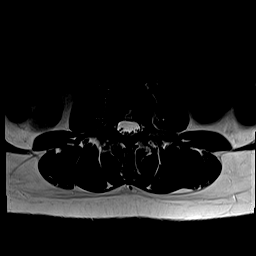
[im 18/34]
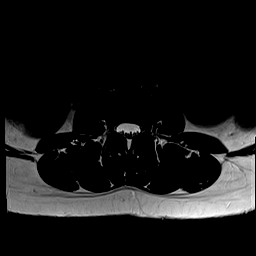
[im 23/34]
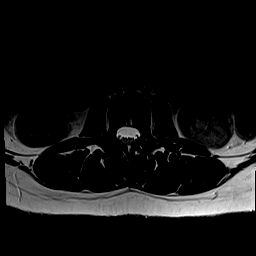
[im 28/34]
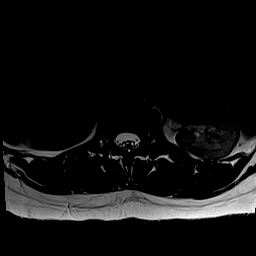
[im 34/34]
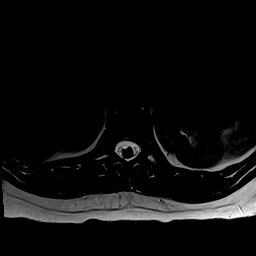

[Series 9: T1 · axial · 4.0mm · 0.39mm/px · z∈[-52,+147]mm · 8 of 34 slices shown (2 of 2)]
[im 1/34]
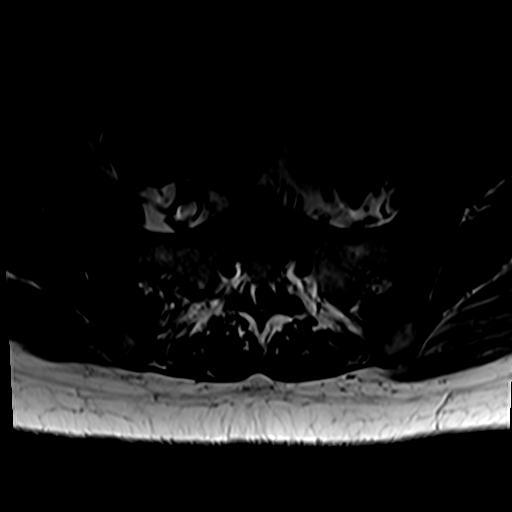
[im 6/34]
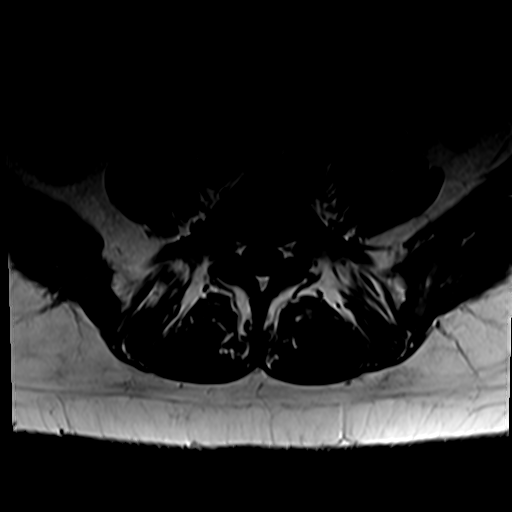
[im 11/34]
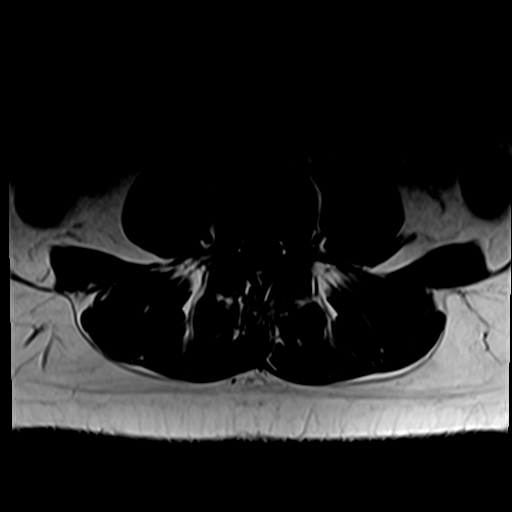
[im 16/34]
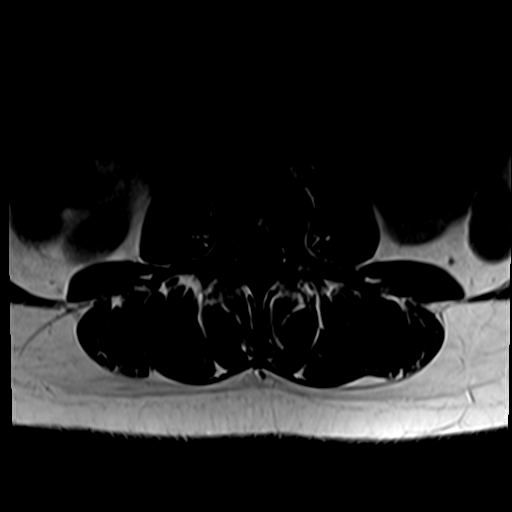
[im 18/34]
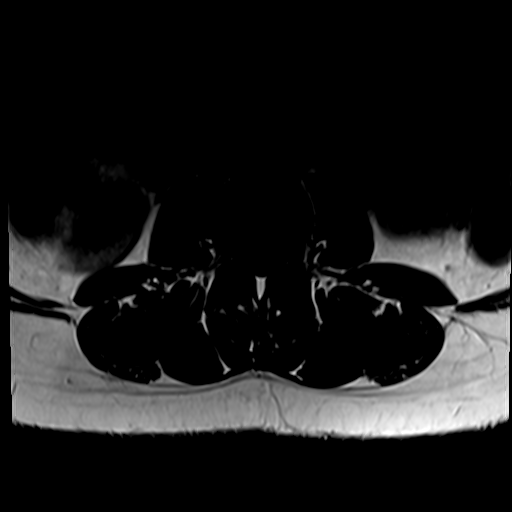
[im 23/34]
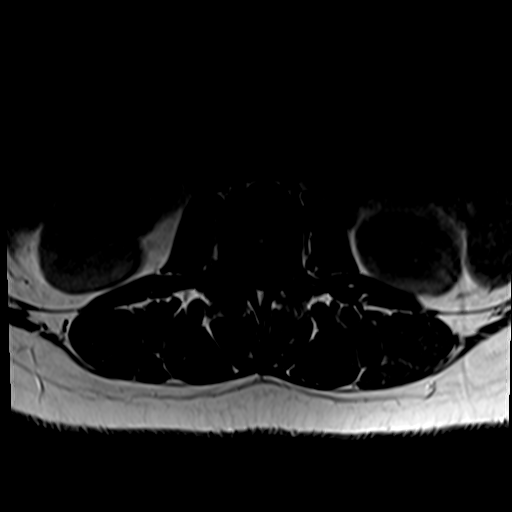
[im 28/34]
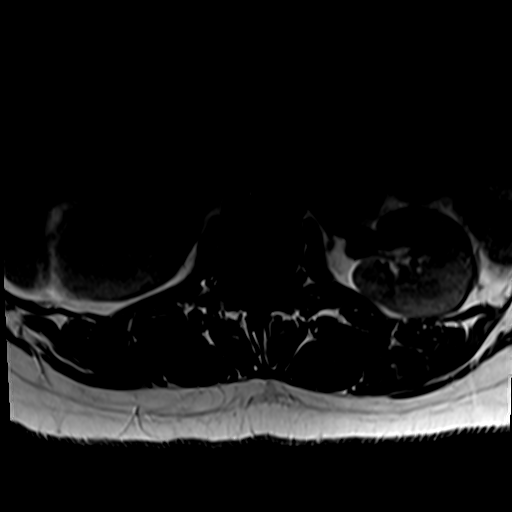
[im 34/34]
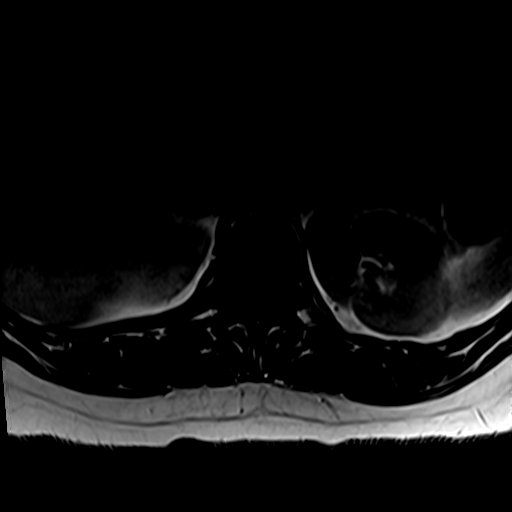

[30 of 48 positions shown; findings below may reference images not displayed]

FINDINGS: Segmentation: Standard. Lowest well-formed disc space labeled the
L5-S1 level.

Alignment: Physiologic with preservation of the normal lumbar
lordosis. No listhesis.

Vertebrae: Vertebral body height maintained without acute or chronic
fracture. Bone marrow signal intensity within normal limits. No
discrete or worrisome osseous lesions. No abnormal marrow edema.

Conus medullaris and cauda equina: Conus extends to the L1-2 level.
Conus and cauda equina appear normal.

Paraspinal and other soft tissues: Paraspinous soft tissues within
normal limits. Visualized visceral structures are normal.

Disc levels:

L1-2:  Unremarkable.

L2-3: Normal interspace. Minimal facet hypertrophy, greater on the
left. No stenosis or impingement.

L3-4: Normal interspace. Minimal facet hypertrophy. No stenosis or
impingement.

L4-5: Disc bulge with disc desiccation. Disc bulging slightly
asymmetric to the left with associated shallow left foraminal to
extraforaminal disc protrusion (series 8, image 26). Protruding disc
closely approximates the exiting left L4 nerve root without frank
impingement. Mild bilateral facet hypertrophy. Resultant mild
narrowing of the lateral recesses bilaterally. Central canal remains
patent. Mild left L4 foraminal narrowing. Right neural foramina
remains patent.

L5-S1: Disc desiccation with mild diffuse disc bulge. Superimposed
small left subarticular disc protrusion with annular fissure (series
8, image 31). Mild facet hypertrophy. Resultant mild narrowing of
the lateral recesses bilaterally, left greater than right. Central
canal remains patent. No significant foraminal encroachment.
IMPRESSION: 1. Shallow left foraminal to extraforaminal disc protrusion at L4-5,
closely approximating and potentially irritating the exiting left L4
nerve root.
2. Small left subarticular disc protrusion at L5-S1, potentially
affecting the descending left S1 nerve root.
3. Mild bilateral facet hypertrophy at L2-3 through L5-S1.

## 2021-05-11 MED ORDER — ISOTRETINOIN 30 MG PO CAPS: 60.0000 mg | ORAL_CAPSULE | Freq: Every day | ORAL | 0 refills | Status: DC

## 2021-05-11 NOTE — Patient Instructions (Addendum)
Hydrocortisone 1 % over the counter - apply daily (Dr. Luvenia Heller cortibalm)    While taking Isotretinoin and for 30 days after you finish the medication, do not get pregnant, do not share pills, do not donate blood. Isotretinoin is best absorbed when taken with a fatty meal. Isotretinoin can make you sensitive to the sun. Daily careful sun protection including sunscreen SPF 30+ when outdoors is recommended.  If you have any questions or concerns for your doctor, please call our main line at [phone] and press option 4 to reach your doctor's medical assistant. If no one answers, please leave a voicemail as directed and we will return your call as soon as possible. Messages left after 4 pm will be answered the following business day.   You may also send [place] a message via [place]. We typically respond to MyChart messages within 1-2 business days.  For prescription refills, please ask your pharmacy to contact our office. Our fax number is [phone].  If you have an urgent issue when the clinic is closed that cannot wait until the next business day, you can page your doctor at the number below.    Please note that while we do our best to be available for urgent issues outside of office hours, we are not available 24/7.   If you have an urgent issue and are unable to reach [place], you may choose to seek medical care at your doctor's office, retail clinic, urgent care center, or emergency room.  If you have a medical emergency, please immediately call 911 or go to the emergency department.  Pager Numbers  - Dr. Nehemiah Massed: [phone]  - Dr. Laurence Ferrari: [phone]  - Dr. Nicole Kindred: [phone]  In the event of inclement weather, please call our main line at [phone] for an update on the status of any delays or closures.  Dermatology Medication Tips: Please keep the boxes that topical medications come in in order to help keep track of the instructions about where and how to use these. Pharmacies  typically print the medication instructions only on the boxes and not directly on the medication tubes.   If your medication is too expensive, please contact our office at [phone] option 4 or send [place] a message through [place].   We are unable to tell what your co-pay for medications will be in advance as this is different depending on your insurance coverage. However, we may be able to find a substitute medication at lower cost or fill out paperwork to get insurance to cover a needed medication.   If a prior authorization is required to get your medication covered by your insurance company, please allow [place] 1-2 business days to complete this process.  Drug prices often vary depending on where the prescription is filled and some pharmacies may offer cheaper prices.  The website www.goodrx.com contains coupons for medications through different pharmacies. The prices here do not account for what the cost may be with help from insurance (it may be cheaper with your insurance), but the website can give you the price if you did not use any insurance.  - You can print the associated coupon and take it with your prescription to the pharmacy.  - You may also stop by our office during regular business hours and pick up a GoodRx coupon card.  - If you need your prescription sent electronically to a different pharmacy, notify our office through [facility] or by phone at [phone] option 4.

## 2021-05-11 NOTE — Progress Notes (Signed)
Isotretinoin Follow-Up Visit   Subjective  Lynn Carroll is a 40 y.o. female who presents for the following: Acne (Patient here today for acne follow up. Reports face is clearer. Patient has been taking 80 mg total daily. She reports side effects area about the same as last month. ). No worsening, but no improvement, including some depression.   Week # 20   Isotretinoin F/U - 05/11/21 1300       Isotretinoin Follow Up   iPledge # [id number]    Date 05/11/21    Weight 151 lb (68.5 kg)    Two Forms of Birth Control Tubal Sterilization    Acne breakouts since last visit? Yes      Dosage   Target Dosage (mg) 10200    Current (To Date) Dosage (mg) 8700    To Go Dosage (mg) 1500      Side Effects   Skin Chapped Lips;Dry Eyes;Dry Lips;Dry Nose;Dry Skin;Nosebleed;Hair Loss;Eye Irritation;Peeling of Fingertips    Gastrointestinal WNL    Neurological Blurred Vision;Depression;Dizziness    Constitutional Fatigue;Muscle/joint aches             Side effects: Dry skin, dry lips  Denies changes in night vision, shortness of breath, abdominal pain, nausea, vomiting, diarrhea, blood in stool or urine, visual changes, headaches, epistaxis, joint pain, myalgias, mood changes, depression, or suicidal ideation.   Patient is not pregnant, not seeking pregnancy, and not breastfeeding.   The following portions of the chart were reviewed this encounter and updated as appropriate: medications, allergies, medical history  Review of Systems:  No other skin or systemic complaints except as noted in HPI or Assessment and Plan.  Objective  Well appearing patient in no apparent distress; mood and affect are within normal limits.  An examination of the face, neck, chest, and back was performed and relevant findings are noted below.   face Violaceous macules cheeks and closed comedone on right mid cheek,  Dry scaly lips, with erythema oral commissures    Assessment & Plan   Acne  vulgaris face  Week #20 isotretinoin Chronic condition with duration or expected duration over one year. Condition is bothersome to patient. Improving but not at goal.  Decrease to 60 mg by mouth daily isotretinoin due to side effects  1 - 2 months of treatment left   Pt at 148.9 mg/kg total.  200 mg/kg is goal since 2nd course   Prior labs reviewed Urine pregnancy test performed in office today and was negative.  Patient demonstrates comprehension and confirms she will not get pregnant.   Patient confirmed in iPledge and isotretinoin sent to pharmacy.  iPLEDGE [id number]  2 forms of bc - tubal ligation and female condoms       ISOtretinoin (ABSORICA) 30 MG capsule - face Take 2 capsules (60 mg total) by mouth daily.   Xerosis secondary to isotretinoin therapy - Continue emollients as directed  Cheilitis secondary to isotretinoin therapy - Continue lip balm as directed, Dr. Luvenia Heller Cortibalm recommended, samples given  Long term medication management (isotretinoin) - While taking Isotretinoin and for 30 days after you finish the medication, do not get pregnant, do not share pills, do not donate blood. Isotretinoin is best absorbed when taken with a fatty meal. Isotretinoin can make you sensitive to the sun. Daily careful sun protection including sunscreen SPF 30+ when outdoors is recommended.  Follow-up in 30 days.  I, Ruthell Rummage, CMA, am acting as scribe for Brendolyn Patty, MD.  Documentation:  I have reviewed the above documentation for accuracy and completeness, and I agree with the above.  Brendolyn Patty MD

## 2021-06-15 ENCOUNTER — Other Ambulatory Visit: Payer: Self-pay

## 2021-06-15 ENCOUNTER — Ambulatory Visit: Payer: 59 | Admitting: Dermatology

## 2021-06-15 VITALS — Wt 151.0 lb

## 2021-06-15 DIAGNOSIS — L700 Acne vulgaris: Secondary | ICD-10-CM | POA: Diagnosis not present

## 2021-06-15 DIAGNOSIS — Z79899 Other long term (current) drug therapy: Secondary | ICD-10-CM | POA: Diagnosis not present

## 2021-06-15 DIAGNOSIS — L853 Xerosis cutis: Secondary | ICD-10-CM

## 2021-06-15 DIAGNOSIS — K130 Diseases of lips: Secondary | ICD-10-CM | POA: Diagnosis not present

## 2021-06-15 MED ORDER — ISOTRETINOIN 40 MG PO CAPS: 40.0000 mg | ORAL_CAPSULE | Freq: Every day | ORAL | 0 refills | Status: AC

## 2021-06-15 NOTE — Progress Notes (Signed)
Isotretinoin Follow-Up Visit   Subjective  Lynn Carroll is a 40 y.o. female who presents for the following: Acne. Joint and muscle pain improved this month, less depression but still more sad and depressed than at baseline; improved with lower dose. She reports family stress in recent months she did not have previously. She denies any thoughts of harming self or others.  She reports she still enjoys the things she used to.  Pt taking Isotretinoin 30 mg 2 tablets a day   Week # 24   Isotretinoin F/U - 06/15/21 1000       Isotretinoin Follow Up   iPledge # [id number]    Date 06/15/21    Weight 151 lb (68.5 kg)    Two Forms of Birth Control Tubal Sterilization;Female Condom    Acne breakouts since last visit? No      Dosage   Target Dosage (mg) 10200    Current (To Date) Dosage (mg) 10300    To Go Dosage (mg) -100      Side Effects   Skin Chapped Lips    Gastrointestinal WNL    Neurological Blurred Vision    Constitutional Fatigue;Muscle/joint aches               PCP  Lang Snow, NP  Side effects: Dry skin, dry lips, sadness improved from prior  Denies changes in night vision, shortness of breath, abdominal pain, nausea, vomiting, diarrhea, blood in stool or urine, visual changes, headaches, epistaxis, joint pain, myalgias, mood changes, depression, or suicidal ideation.   Patient is not pregnant, not seeking pregnancy, and not breastfeeding.   The following portions of the chart were reviewed this encounter and updated as appropriate: medications, allergies, medical history  Review of Systems:  No other skin or systemic complaints except as noted in HPI or Assessment and Plan.  Objective  Well appearing patient in no apparent distress; mood and affect are within normal limits.  An examination of the face, neck, chest, and back was performed and relevant findings are noted below.   face,chest,back Single inflammatory papule at chest, face and back  clear    Assessment & Plan   Acne vulgaris face,chest,back  Severe and Chronic (present >1 year); currently on Isotretinoin and not to goal (must reach target dose based on weight and also have clear skin for 2 months prior to discontinuation in order to help prevent relapse)  She completed a previous course of isotretinoin and acne recurred, so she is on her second course.  She reports some sadness and depressed mood since being on the higher doses of isotretinoin this time. She did not have any trouble with her first course of treatment. She reports that this has improved in the last month since dropping to the lower dose. She denies any history of SI or thoughts of harming herself or others.   She reports current stressors at home with her family which may be a factor in her mood. She does take escitalopram prescribed by her PCP.   Discussed stopping isotretinoin given her mood changes since being on the higher doses, but she strongly wants to finish this course and notes she has felt better at the lower dose.  Will decrease dose to 40 mg daily and continue to monitor as she did not have mood changes with the lower doses.  She advises she will stop the medication immediately and seek immediate medical care if she has worsening of her mood or at any point  has any thoughts of harming herself. She overall appears to have good insight. Her affect is normal today.  Week #24 isotretinoin    WK 24 IPLEDGE# [id number] 2 forms of birth control- tubal ligation, female latex condoms Pharmacy [facility]   Total mg = 10,300  mg Total mg/kg =  150 mg/kg Labs from 03/01/21 viewed Decrease Absorica to 40 mg daily with meal   Prior labs reviewed Urine pregnancy test performed in office today and was negative.  Patient demonstrates comprehension and confirms she will not get pregnant.    Patient confirmed in iPledge and isotretinoin sent to pharmacy.    Related Medications ISOtretinoin (ABSORICA)  30 MG capsule Take 2 capsules (60 mg total) by mouth daily.   Xerosis secondary to isotretinoin therapy - Continue emollients as directed  Cheilitis secondary to isotretinoin therapy - Continue lip balm as directed, Dr. Luvenia Heller Cortibalm recommended  Long term medication management (isotretinoin) - While taking Isotretinoin and for 30 days after you finish the medication, do not get pregnant, do not share pills, do not donate blood. Isotretinoin is best absorbed when taken with a fatty meal. Isotretinoin can make you sensitive to the sun. Daily careful sun protection including sunscreen SPF 30+ when outdoors is recommended.  Follow-up in 30 days.  CC'd her PCP Lang Snow, NP  I, Marye Round, CMA, am acting as scribe for Forest Gleason, MD .   Documentation: I have reviewed the above documentation for accuracy and completeness, and I agree with the above.  Forest Gleason, MD

## 2021-06-15 NOTE — Patient Instructions (Signed)

## 2021-06-16 ENCOUNTER — Encounter: Payer: Self-pay | Admitting: Dermatology

## 2021-06-16 ENCOUNTER — Ambulatory Visit: Payer: 59

## 2021-06-22 ENCOUNTER — Ambulatory Visit: Payer: 59

## 2021-06-22 ENCOUNTER — Ambulatory Visit: Payer: 59 | Attending: Obstetrics and Gynecology

## 2021-06-22 ENCOUNTER — Other Ambulatory Visit: Payer: Self-pay

## 2021-06-22 DIAGNOSIS — R278 Other lack of coordination: Secondary | ICD-10-CM | POA: Insufficient documentation

## 2021-06-22 DIAGNOSIS — N3946 Mixed incontinence: Secondary | ICD-10-CM | POA: Diagnosis not present

## 2021-06-22 DIAGNOSIS — M6281 Muscle weakness (generalized): Secondary | ICD-10-CM | POA: Insufficient documentation

## 2021-06-22 DIAGNOSIS — M5440 Lumbago with sciatica, unspecified side: Secondary | ICD-10-CM | POA: Insufficient documentation

## 2021-06-22 NOTE — Therapy (Signed)
[place] [facility] [address] [place], [place], [address] Phone: [phone]   Fax:  [phone]  Physical Therapy Evaluation  Patient Details  Name: Lynn Carroll MRN: [id number] Date of Birth: Oct 29, 1980 Referring Provider (PT): Dr. Benjaman Kindler   Encounter Date: 06/22/2021   PT End of Session - 06/23/21 0924     Visit Number 1    Number of Visits 10    Date for PT Re-Evaluation 08/31/21    Authorization Type [employer]: 30 PT/OT visits combined, 14 used, 16 left    Authorization - Number of Visits 1    Progress Note Due on Visit 16    PT Start Time 1105    PT Stop Time 1200    PT Time Calculation (min) 55 min    Activity Tolerance Patient tolerated treatment well    Behavior During Therapy [facility] for tasks assessed/performed             Past Medical History:  Diagnosis Date   Acne    Anxiety    Dermatitis    Nasal congestion     Past Surgical History:  Procedure Laterality Date   ANAL FISSURE REPAIR N/A 04/03/2020   Procedure: ANAL FISSURE REPAIR w/ botox;  Surgeon: Benjamine Sprague, DO;  Location: [facility] ORS;  Service: General;  Laterality: N/A;   TUBAL LIGATION      There were no vitals filed for this visit.    Subjective Assessment - 06/22/21 1116     Subjective Pt reported she has B hip pain and pain with sexual intercourse. She also has SUI with sneezing and coughing. She wears panty liners daily, 2-3/day. Pt states she has to go to the bathroom often, 4-5x in a four hour period. Stream of urine is normal. Pt gets up 1-2x/night to void. Pt denied pressure or feeling of falling out. Sexual function: pain occurs during intercourse with deeper penetration. Pt denied pain during OBGYN exam or tampon insertion. Core: pt reported chronic LBP with pain radiating to B hips. Pain incr. with walking longer distances (4 hours) and exercises (squats, squeeze glutes, hip thrusts). At worst pain is: 7/10 before  injections in back, on average: 5/10, at best: 0/10. Bowel function: has constipation and takes fiber. She has a bowel movement 1-2x/a day.  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 Sweeping, mopping, bending, scrubbing, lifts chairs.    Patient is accompained by: Interpreter   Jacqui   Pertinent History B plantar fasciitis, irregular menses, allergies, anxiety, chronic LBP with sciatica, dermatitis    How long can you sit comfortably? has to mindful of posture but sitting doesn't really cause pain    How long can you stand comfortably? about 10 minutes 2/2 plantar fasciitis    How long can you walk comfortably? four hours    Patient Stated Goals No pain during sex, to learn how to tighten the muscles around the bladder and stomach to reduce leakage.    Currently in Pain? Yes    Pain Score 5     Pain Location Back    Pain Orientation Lower    Pain Descriptors / Indicators Sharp    Pain Type Chronic pain    Pain Onset More than a month ago    Pain Frequency Intermittent    Aggravating Factors  walking and standing    Pain Relieving Factors rest  [facility] PT Assessment - 06/22/21 1131       Assessment   Medical Diagnosis SUI and other specified disorders of muscle    Referring Provider (PT) Dr. Benjaman Kindler    Onset Date/Surgical Date --   approx. 8 years ago   Hand Dominance Right    Prior Therapy none for pelvic health      Precautions   Precautions None      Restrictions   Weight Bearing Restrictions No      Balance Screen   Has the patient fallen in the past 6 months No      [place] residence    Living Arrangements Spouse/significant other;Children    Available Help at Discharge Family    Type of [place] to enter    Entrance Stairs-Number of Steps 1    [place] One level                         Objective measurements completed on examination: See above findings.     Pelvic Floor Special Questions - 06/22/21 1134     Are you Pregnant or attempting pregnancy? No    Prior Pregnancies Yes    Number of Pregnancies 4    Number of C-Sections 0    Number of Vaginal Deliveries 3    Any difficulty with labor and deliveries Yes    Episiotomy Performed Yes    Currently Sexually Active Yes    Is this Painful Yes    History of sexually transmitted disease No    Marinoff Scale pain interrupts completion    Urinary Leakage Yes    How often Daily    Pad use 2-3    Activities that cause leaking Coughing;Sneezing;Exercising    Urinary urgency Yes    Urinary frequency 4-5x in four hours.    Fecal incontinence No    Fluid intake Drinks water throughout the day (almost a gallon). Herbal tea    Caffeine beverages Coffee, black and green tea              Pt works out at gym 4x/week for a max of two hours, strength training (machines) and cardio (elliptical and bike). She has pain during exercise (anything that engages glutes and hamstrings)     Pelvic Floor Physical Therapy Evaluation and Assessment  Screenings: Red Flags: no Have you had any night sweats? Unexplained weight loss? Saddle anesthesia? Unexplained changes in bowel or bladder changes?  [place] Stool Chart:  70% type 4 and 30% type 1   OBJECTIVE  Posture/Observations:  Sitting: LEs not crossed but feet not flat 2/2 height Standing: decr. Tx spine kyphosis, incr. Lx spine lordosis, ant. Pelvic tilt.      Range of Motion/Flexibilty:  Spine: concordant pain with R sidebending ( L LBP), B rotation incr. Tx spine pain    Strength/MMT:  LE MMT  B knee ext: 4+/5 B knee flex: 4-/5 B ankle DF: 4/5 LE MMT Left Right  Hip flex:  (L2) 4/5 4/5  Hip ext: /5 /5  Hip abd: /5 /5  Hip add: /5 /5  Hip IR /5 /5  Hip ER /5 /5        PT Education - 06/23/21 3474     Education  Details PT educated pt on what to expect with pelvic health PT, POC, exam findings, frequency and duration.  Person(s) Educated Patient    Methods Explanation    Comprehension Verbalized understanding              PT Short Term Goals - 06/23/21 0932       PT SHORT TERM GOAL #1   Title Pt will be IND in HEP in order to improve pain, posture, balance, and strength.    Time 4    Period Weeks    Status New    Target Date 07/20/21      PT SHORT TERM GOAL #2   Title Complete FOTO and write goal as indicated.    Time 4    Period Weeks    Status New    Target Date 07/20/21      PT SHORT TERM GOAL #3   Title Pt will demonstrate proper alignment and improve strength of musculature surrounding pelvis in order to decr. pain by 1 point or greater during work.    Time 4    Period Weeks    Status New               PT Long Term Goals - 06/23/21 0934       PT LONG TERM GOAL #1   Title Pt will demonstrate proper PFM coordination in order to insert tampon, participate in intercourse and OBGYN without pelvic pain.    Time 8    Period Weeks    Status New    Target Date 08/17/21      PT LONG TERM GOAL #2   Title Pt will demonstrate proper posture and alignment in stance in order to stand for 30+ minutes without incr. in pain in order to perform work tasks.    Time 8    Period Weeks    Status New    Target Date 08/17/21      PT LONG TERM GOAL #3   Title Pt will demonstrate proper PFM contraction in order to report no SUI or UUI over the last 4 weeks.    Time 10    Period Weeks    Status New    Target Date 08/31/21      PT LONG TERM GOAL #4   Title Pt will be able to perform R and L SLS for >10 sec. without UE support or LOB in order to improve safety during all functional mobility tasks.    Time 10    Period Weeks    Status New    Target Date 08/31/21      PT LONG TERM GOAL #5   Title Pt will demonstrate proper posture during strength training exercises in order to  perform gym activities without incr. in pain.    Time 10    Period Weeks    Status New    Target Date 08/31/21                    Plan - 06/23/21 0926     Clinical Impression Statement Pt is a pleasant 40 y/o female presenting to OP pelvic health PT for SUI and back/pelvic/hip pain. Pt's PMH is significant for the following:   B plantar fasciitis, irregular menses, allergies, anxiety, chronic LBP with sciatica, dermatitis. The following impairments were noted upon exam: decr. strength, decr. ROM, concordant back pain with tx spine rotation and R sidebending, postural dysfunction: decr. Tx spine kyphosis, incr. Lx spine lordosis, ant. Pelvic tilt. impaired balance, constipation, and incontinence. Pt also reported pelvic pain during intercourse. Pt would benefit from  skilled PT to improve all deficits listed above in order to improve QOL and decr. pain while incr. safety during all functional mobility activities.    Personal Factors and Comorbidities Comorbidity 3+;Profession    Comorbidities decr. Tx spine kyphosis, incr. Lx spine lordosis, ant. Pelvic tilt.    Examination-Activity Limitations Bend;Lift;Caring for Others;Carry;Continence;Toileting;Squat;Transfers;Stand;Locomotion Level    Examination-Participation Restrictions Cleaning;Laundry;Meal Prep;Occupation    Stability/Clinical Decision Making Stable/Uncomplicated    Clinical Decision Making Low    Rehab Potential Good    PT Frequency 1x / week    PT Duration Other (comment)   10 weeks   PT Treatment/Interventions ADLs/Self Care Home Management;Biofeedback;Gait training;Stair training;Functional mobility training;Therapeutic activities;Neuromuscular re-education;Balance training;Therapeutic exercise;Patient/family education;Manual techniques;Taping;Joint Manipulations    PT Next Visit Plan Complete spine and hip assessment. Initiate HEP. Internal exam prn. Discuss toileting posture.    Consulted and Agree with Plan of Care  Patient             Patient will benefit from skilled therapeutic intervention in order to improve the following deficits and impairments:  Abnormal gait, Decreased balance, Decreased mobility, Hypomobility, Postural dysfunction, Pain, Impaired flexibility, Decreased strength, Decreased coordination, Decreased range of motion  Visit Diagnosis: Mixed incontinence - Plan: PT plan of care cert/re-cert  Muscle weakness (generalized) - Plan: PT plan of care cert/re-cert  Other lack of coordination - Plan: PT plan of care cert/re-cert  Low back pain with sciatica, sciatica laterality unspecified, unspecified back pain laterality, unspecified chronicity - Plan: PT plan of care cert/re-cert     Problem List Patient Active Problem List   Diagnosis Date Noted   Anxiety reaction 03/11/2020   Other hemorrhoids 12/31/2019   Bilateral plantar fasciitis 10/15/2019   Chronic bilateral low back pain with sciatica 10/01/2019   Irregular menses 10/01/2019   Recurrent infection of skin 10/01/2019   Seasonal allergies 10/01/2019    Hiya Point L, PT 06/23/2021, 9:40 AM  [facility] [address] [place], [place], [address] Phone: [phone]   Fax:  [phone]  Name: Vernee Baines MRN: [id number] Date of Birth: Oct 09, 1980  Geoffry Paradise, PT,DPT 06/23/21 9:41 AM Phone: [phone] Fax: [phone]

## 2021-06-28 ENCOUNTER — Ambulatory Visit: Payer: 59

## 2021-07-05 ENCOUNTER — Other Ambulatory Visit: Payer: Self-pay

## 2021-07-05 ENCOUNTER — Ambulatory Visit: Payer: 59

## 2021-07-05 DIAGNOSIS — M6281 Muscle weakness (generalized): Secondary | ICD-10-CM

## 2021-07-05 DIAGNOSIS — M5440 Lumbago with sciatica, unspecified side: Secondary | ICD-10-CM

## 2021-07-05 DIAGNOSIS — R278 Other lack of coordination: Secondary | ICD-10-CM

## 2021-07-05 DIAGNOSIS — N3946 Mixed incontinence: Secondary | ICD-10-CM

## 2021-07-05 NOTE — Therapy (Signed)
[place] [facility] SERVICES [address] [place], [place], [address] Phone: [phone]   Fax:  [phone]  Physical Therapy Treatment  Patient Details  Name: Lynn Carroll MRN: [id number] Date of Birth: [birth year] Referring Provider (PT): Dr. Benjaman Kindler   Encounter Date: 07/05/2021   PT End of Session - 07/05/21 1419     Visit Number 2    Number of Visits 10    Date for PT Re-Evaluation 08/31/21    Authorization Type [employer]: 30 PT/OT visits combined, 14 used, 16 left    Authorization - Number of Visits 2    Progress Note Due on Visit 16    PT Start Time 1307    PT Stop Time 1405    PT Time Calculation (min) 58 min    Activity Tolerance Patient tolerated treatment well    Behavior During Therapy [facility] for tasks assessed/performed             Past Medical History:  Diagnosis Date   Acne    Anxiety    Dermatitis    Nasal congestion     Past Surgical History:  Procedure Laterality Date   ANAL FISSURE REPAIR N/A 04/03/2020   Procedure: ANAL FISSURE REPAIR w/ botox;  Surgeon: Benjamine Sprague, DO;  Location: [facility] ORS;  Service: General;  Laterality: N/A;   TUBAL LIGATION      There were no vitals filed for this visit.   Subjective Assessment - 07/05/21 1310     Subjective Pt reported she's about the same since last visit. Pt is still having pain with intercourse, going to the bathroom often, and experiencing SUI during coughing, sneezing, lifting, and with yelling.    Patient is accompained by: Interpreter   Milly   Patient Stated Goals No pain during sex, to learn how to tighten the muscles around the bladder and stomach to reduce leakage.    Currently in Pain? No/denies                NMR: Pelvic Floor Physical Therapy Evaluation and Assessment   OBJECTIVE  Posture/Observations:  Sitting: intermittent LEs crossed at ankles Standing: incr. Cx spine lordosis  Palpation/Segmental  Motion/Joint Play: hypomobility and pain at T5-T10  Special tests:   R ASLR: 4/5  L ASLR: 3/5 Indicating poor load txf  With TrA contraction R SLR: 1/5 L SLR: 1/5 With reported decr. B SI joint pain   Range of Motion/Flexibilty:  Spine: pt reported concordant pain with spine flex and ext (flex>ext) Hips: WFL with pain reported during B hip IR (L>R)   Strength/MMT:  LE MMT  B knee ext: 4+/5 with L knee pain during L knee ext B knee flex: 3+/5 with SI joint pain on ipsilat. Side B ankle DF: 4/5 LE MMT Left Right  Hip flex:  (L2) 3+/5 3+/5  Hip ext: /5 /5  Hip abd: 3+/5 3+/5  Hip add: 2+/5 2+/5  Hip IR /5 /5  Hip ER /5 /5     Abdominal:  Palpation: some TTP inf. To umbilicus Diastasis: none noted  Pelvic Floor External Exam: Introitus Appears:  Skin integrity:  Palpation: Cough: Prolapse visible?: Scar mobility: Through clothing: yes Ischial tuberosities: no TTP Palpation for pelvic floor contraction: unable to contract without glute compensation Coccyx: TTP with deviation to the R side, with pt in L sidelying  SELF CARE:  PT Education - 07/05/21 1415     Education Details PT educated pt on proper lifting techniques, housekeeping duties with proper body mechanics for work, proper supine<>sit txfs to reduce back pain and proper toileting techniques.    Person(s) Educated Patient    Methods Explanation;Demonstration;Verbal cues;Handout    Comprehension Verbalized understanding;Returned demonstration;Need further instruction              PT Short Term Goals - 06/23/21 0932       PT SHORT TERM GOAL #1   Title Pt will be IND in HEP in order to improve pain, posture, balance, and strength.    Time 4    Period Weeks    Status New    Target Date 07/20/21      PT SHORT TERM GOAL #2   Title Complete FOTO and write goal as indicated.    Time 4    Period Weeks    Status New    Target Date 07/20/21      PT SHORT  TERM GOAL #3   Title Pt will demonstrate proper alignment and improve strength of musculature surrounding pelvis in order to decr. pain by 1 point or greater during work.    Time 4    Period Weeks    Status New               PT Long Term Goals - 06/23/21 0934       PT LONG TERM GOAL #1   Title Pt will demonstrate proper PFM coordination in order to insert tampon, participate in intercourse and OBGYN without pelvic pain.    Time 8    Period Weeks    Status New    Target Date 08/17/21      PT LONG TERM GOAL #2   Title Pt will demonstrate proper posture and alignment in stance in order to stand for 30+ minutes without incr. in pain in order to perform work tasks.    Time 8    Period Weeks    Status New    Target Date 08/17/21      PT LONG TERM GOAL #3   Title Pt will demonstrate proper PFM contraction in order to report no SUI or UUI over the last 4 weeks.    Time 10    Period Weeks    Status New    Target Date 08/31/21      PT LONG TERM GOAL #4   Title Pt will be able to perform R and L SLS for >10 sec. without UE support or LOB in order to improve safety during all functional mobility tasks.    Time 10    Period Weeks    Status New    Target Date 08/31/21      PT LONG TERM GOAL #5   Title Pt will demonstrate proper posture during strength training exercises in order to perform gym activities without incr. in pain.    Time 10    Period Weeks    Status New    Target Date 08/31/21                   Plan - 07/05/21 1420     Clinical Impression Statement Today's skilled session focused on completing exam and providing pt with education to reduce pain during job, ADLs and intercourse and to relax pelvic floor during toileting. Pt noted to have decr. strength in BLEs and hips, especially B hip abd/add. PT unable  to palpate pelvic floor contraction in sidelying (externally) without pt uses glutes to compensate, indicating decr. strength and likely contributing  factor to SUI. Pt also noted TTP along spine, SI joints, and coccyx, With coccyx deviating to R side in L sidelying. Pt also noted to experience pain during palpation of B diaphragm with incr. tension noted closer to sternum. Pt would continue to benefit from skilled PT to improve the deficits listed above.    PT Treatment/Interventions ADLs/Self Care Home Management;Biofeedback;Gait training;Stair training;Functional mobility training;Therapeutic activities;Neuromuscular re-education;Balance training;Therapeutic exercise;Patient/family education;Manual techniques;Taping;Joint Manipulations    PT Next Visit Plan Palpate adductors. Initiate HEP. Coccyx MT. Internal exam this visit.    Consulted and Agree with Plan of Care Patient             Patient will benefit from skilled therapeutic intervention in order to improve the following deficits and impairments:     Visit Diagnosis: Mixed incontinence  Muscle weakness (generalized)  Other lack of coordination  Low back pain with sciatica, sciatica laterality unspecified, unspecified back pain laterality, unspecified chronicity     Problem List Patient Active Problem List   Diagnosis Date Noted   Anxiety reaction 03/11/2020   Other hemorrhoids 12/31/2019   Bilateral plantar fasciitis 10/15/2019   Chronic bilateral low back pain with sciatica 10/01/2019   Irregular menses 10/01/2019   Recurrent infection of skin 10/01/2019   Seasonal allergies 10/01/2019    Ketzaly Cardella L, PT 07/05/2021, 2:33 PM  [facility] [address] [place], [place], [address] Phone: [phone]   Fax:  [phone]  Name: Koral Thaden MRN: [id number] Date of Birth: Apr 23, 1981  Geoffry Paradise, PT,DPT 07/05/21 2:37 PM Phone: [phone] Fax: [phone]

## 2021-07-05 NOTE — Patient Instructions (Addendum)
Access Code: [email or web address] URL: [email or web address] Date: 07/05/2021 Prepared by: Geoffry Paradise  Patient Education Household Activities Lifting Techniques  TOILET POSTURE: Urination: feet flat, lean forward with forearms on legs to fully empty bladder. Bowel movement: place feet flat on Squatty Potty or stool so knees are higher than hips, lean forward to relax pelvic floor in order to avoid strain.

## 2021-07-12 ENCOUNTER — Other Ambulatory Visit: Payer: Self-pay

## 2021-07-12 ENCOUNTER — Ambulatory Visit: Payer: 59

## 2021-07-12 ENCOUNTER — Ambulatory Visit: Payer: 59 | Attending: Obstetrics and Gynecology

## 2021-07-12 DIAGNOSIS — R278 Other lack of coordination: Secondary | ICD-10-CM | POA: Insufficient documentation

## 2021-07-12 DIAGNOSIS — N3946 Mixed incontinence: Secondary | ICD-10-CM | POA: Insufficient documentation

## 2021-07-12 DIAGNOSIS — M5440 Lumbago with sciatica, unspecified side: Secondary | ICD-10-CM | POA: Diagnosis present

## 2021-07-12 DIAGNOSIS — M6281 Muscle weakness (generalized): Secondary | ICD-10-CM | POA: Insufficient documentation

## 2021-07-12 NOTE — Patient Instructions (Signed)
Access Code: [email or web address] URL: [email or web address] Date: 07/12/2021 Prepared by: Geoffry Paradise  Exercises Supine Pelvic Tilt - 1 x daily - 7 x weekly - 3 sets - 10 reps Supine Diaphragmatic Breathing - 1 x daily - 7 x weekly - 1 sets - 5 reps - 2 hold Child's Pose Stretch - 1 x daily - 7 x weekly - 1 sets - 3 reps - 30-60 hold  Patient Education: reviewed Medical illustrator

## 2021-07-12 NOTE — Therapy (Addendum)
[place] MAIN [facility] SERVICES [address] [place], [place], [address] Phone: [phone]   Fax:  [phone]  Physical Therapy Treatment  Patient Details  Name: Lynn Carroll MRN: [id number] Date of Birth: 1981-03-26 Referring Provider (PT): Dr. Benjaman Kindler   Encounter Date: 07/12/2021   PT End of Session - 07/12/21 1414     Visit Number 3    Number of Visits 10    Date for PT Re-Evaluation 08/31/21    Authorization Type [employer]: 30 PT/OT visits combined, 14 used, 16 left    Authorization - Visit Number 3    Authorization - Number of Visits 16    Progress Note Due on Visit 10    PT Start Time 8588    PT Stop Time 5027   with pt completing FOTO with interpreter, Estill Bamberg until approx. 1415   PT Time Calculation (min) 60 min    Activity Tolerance Patient tolerated treatment well    Behavior During Therapy WFL for tasks assessed/performed             Past Medical History:  Diagnosis Date   Acne    Anxiety    Dermatitis    Nasal congestion     Past Surgical History:  Procedure Laterality Date   ANAL FISSURE REPAIR N/A 04/03/2020   Procedure: ANAL FISSURE REPAIR w/ botox;  Surgeon: Benjamine Sprague, DO;  Location: [facility] ORS;  Service: General;  Laterality: N/A;   TUBAL LIGATION      There were no vitals filed for this visit.   Subjective Assessment - 07/12/21 1308     Subjective Pt reported she started a supplement, Hebalife: Cell-U-Loss once a day and Prolessa Duo powser once a day and Total Control. Pt reported her B ankles have been painful but she hasn't done anything different. Pt stated she forgot to perform lifting technique but has been performing toileting posture and mopping and it helps.    Patient is accompained by: Interpreter   Estill Bamberg   Pertinent History B plantar fasciitis, irregular menses, allergies, anxiety, chronic LBP with sciatica, dermatitis    Patient Stated Goals No pain during sex, to  learn how to tighten the muscles around the bladder and stomach to reduce leakage.    Currently in Pain? No/denies                NMR: Access Code: [email or web address] URL: [email or web address] Date: 07/12/2021 Prepared by: Geoffry Paradise  Exercises Supine Pelvic Tilt - 1 x daily - 7 x weekly - 3 sets - 10 reps Supine Diaphragmatic Breathing - 1 x daily - 7 x weekly - 1 sets - 5 reps - 2 hold Child's Pose Stretch - 1 x daily - 7 x weekly - 1 sets - 3 reps - 30-60 hold  Cues and demo for each activity. Performed with S for safety.  Patient Education: reviewed only  Medical illustrator               [facility] Adult PT Treatment/Exercise - 07/12/21 1429       Manual Therapy   Manual Therapy Internal Pelvic Floor    Manual therapy comments Pt reported 5/10 pain initially with exam and incr. PFM tension noted, but pt reported decr. in pain to 4/10 and less tension noted.    Internal Pelvic Floor PT performed internal pelvic floor muscle (PFM) assessment. Pt had difficulty coordinating PFM contraction with exhale and difficulty relaxing. Pt reported incr. tension in  B levator ani musculature, therefore, PT performed trigger point release with noted decr. in tension after STM. PT also educated pt on diaphragmatic breathing and body scan to decr. pelvic floor tension.                     PT Education - 07/12/21 1411     Education Details PT discussed internal exam findings. PT provided pt with relaxation technique and stretches to decr. pelvic floor tension. PT discussed activities to perform at work to [employer]. back/hip pain, such as elevating foot to improve post. pelvic tilt. PT educated pt not to peform PFM contractions as HEP at this time, as tension needs to be decr. first.    Person(s) Educated Patient    Methods Demonstration;Explanation;Tactile cues;Verbal cues;Handout    Comprehension Returned demonstration;Verbalized  understanding;Need further instruction              PT Short Term Goals - 07/12/21 1419       PT SHORT TERM GOAL #1   Title Pt will be IND in HEP in order to improve pain, posture, balance, and strength.    Time 4    Period Weeks    Status New    Target Date 07/20/21      PT SHORT TERM GOAL #2   Title Complete FOTO and write goal as indicated.    Time 4    Period Weeks    Status Achieved    Target Date 07/20/21      PT SHORT TERM GOAL #3   Title Pt will demonstrate proper alignment and improve strength of musculature surrounding pelvis in order to decr. pain by 1 point or greater during work.    Time 4    Period Weeks    Status New               PT Long Term Goals - 07/12/21 1439       PT LONG TERM GOAL #1   Title Pt will demonstrate proper PFM coordination in order to insert tampon, participate in intercourse and OBGYN without pelvic pain.    Time 8    Period Weeks    Status New      PT LONG TERM GOAL #2   Title Pt will demonstrate proper posture and alignment in stance in order to stand for 30+ minutes without incr. in pain in order to perform work tasks.    Time 8    Period Weeks    Status New      PT LONG TERM GOAL #3   Title Pt will demonstrate proper PFM contraction in order to report no SUI or UUI over the last 4 weeks.    Time 10    Period Weeks    Status New      PT LONG TERM GOAL #4   Title Pt will be able to perform R and L SLS for >10 sec. without UE support or LOB in order to improve safety during all functional mobility tasks.    Time 10    Period Weeks    Status New      PT LONG TERM GOAL #5   Title Pt will demonstrate proper posture during strength training exercises in order to perform gym activities without incr. in pain.    Time 10    Period Weeks    Status New      Additional Long Term Goals   Additional Long Term Goals Yes  PT LONG TERM GOAL #6   Title Pt will improve FOTO Urinary problems score from 50 to 60 to  improve QOL.    Baseline 50    Time 10    Period Weeks    Status New    Target Date 08/31/21                   Plan - 07/12/21 1415     Clinical Impression Statement Pt reported pain was a little better after manual therapy and stretches, as pelvic/hip pain incr. to 5/10 during internal muscle assessment and decr. to 4/10 after manual therapy and stretches. Pt had difficulty coordinating pelvic floor muscle contraction 2/2 incr. PFM tension and weakness. Pt's pelvic pain, hip/back pain and incontinence likely 2/2 PFM weakness 2/2 incr. tension in pelvic floor. Pt required tactile and verbal cues and demo for proper technique of pelvic floor tilts 2/2 difficulty coordinating and isloating pelvic movement but improved with cues. Pt would continue to benefit from skilled PT to improve deficits listed above.    PT Treatment/Interventions ADLs/Self Care Home Management;Biofeedback;Gait training;Stair training;Functional mobility training;Therapeutic activities;Neuromuscular re-education;Balance training;Therapeutic exercise;Patient/family education;Manual techniques;Taping;Joint Manipulations    PT Next Visit Plan Palpate adductors and back-MT prn. Review HEP prn.    Consulted and Agree with Plan of Care Patient             Patient will benefit from skilled therapeutic intervention in order to improve the following deficits and impairments:  Abnormal gait, Decreased balance, Decreased mobility, Hypomobility, Postural dysfunction, Pain, Impaired flexibility, Decreased strength, Decreased coordination, Decreased range of motion  Visit Diagnosis: Other lack of coordination  Muscle weakness (generalized)  Low back pain with sciatica, sciatica laterality unspecified, unspecified back pain laterality, unspecified chronicity  Mixed incontinence     Problem List Patient Active Problem List   Diagnosis Date Noted   Anxiety reaction 03/11/2020   Other hemorrhoids 12/31/2019    Bilateral plantar fasciitis 10/15/2019   Chronic bilateral low back pain with sciatica 10/01/2019   Irregular menses 10/01/2019   Recurrent infection of skin 10/01/2019   Seasonal allergies 10/01/2019    Delance Weide L, PT 07/12/2021, 2:40 PM  [facility] [address] [place], [place], [address] Phone: [phone]   Fax:  [phone]  Name: Lynn Carroll MRN: [id number] Date of Birth: [birth year]   Geoffry Paradise, PT,DPT 07/12/21 2:40 PM Phone: [phone] Fax: [phone]

## 2021-07-16 ENCOUNTER — Other Ambulatory Visit: Payer: Self-pay | Admitting: Surgery

## 2021-07-19 ENCOUNTER — Ambulatory Visit: Payer: 59

## 2021-07-20 ENCOUNTER — Other Ambulatory Visit: Payer: Self-pay

## 2021-07-20 ENCOUNTER — Ambulatory Visit (INDEPENDENT_AMBULATORY_CARE_PROVIDER_SITE_OTHER): Payer: 59 | Admitting: Dermatology

## 2021-07-20 VITALS — Wt 151.0 lb

## 2021-07-20 DIAGNOSIS — Z79899 Other long term (current) drug therapy: Secondary | ICD-10-CM

## 2021-07-20 DIAGNOSIS — K130 Diseases of lips: Secondary | ICD-10-CM

## 2021-07-20 DIAGNOSIS — L700 Acne vulgaris: Secondary | ICD-10-CM

## 2021-07-20 DIAGNOSIS — L853 Xerosis cutis: Secondary | ICD-10-CM | POA: Diagnosis not present

## 2021-07-20 DIAGNOSIS — L609 Nail disorder, unspecified: Secondary | ICD-10-CM

## 2021-07-20 MED ORDER — ISOTRETINOIN 40 MG PO CAPS: 40.0000 mg | ORAL_CAPSULE | Freq: Every day | ORAL | 0 refills | Status: AC

## 2021-07-20 NOTE — Progress Notes (Signed)
Isotretinoin Follow-Up Visit   Subjective  Lynn Carroll Anjolaoluwa Siguenza is a 40 y.o. female who presents for the following: Acne. Isotretinoin f/u. Pt taking Absorica 20 mg twice a day with a good response, pt c/o enlarged pores.   +dry skin   Week # 30   Isotretinoin F/U - 07/20/21 0900       Isotretinoin Follow Up   iPledge # [id number]    Date 07/20/21    Weight 151 lb (68.5 kg)    Two Forms of Birth Control Tubal Sterilization;Female Condom    Acne breakouts since last visit? No      Side Effects   Skin Dry Skin    Gastrointestinal WNL    Neurological WNL    Constitutional WNL              Side effects: Dry skin, dry lips  Denies changes in night vision, shortness of breath, abdominal pain, nausea, vomiting, diarrhea, blood in stool or urine, visual changes, headaches, epistaxis, joint pain, myalgias, mood changes, depression, or suicidal ideation.   Patient is not pregnant, not seeking pregnancy, and not breastfeeding.   The following portions of the chart were reviewed this encounter and updated as appropriate: medications, allergies, medical history  Review of Systems:  No other skin or systemic complaints except as noted in HPI or Assessment and Plan.  Objective  Well appearing patient in no apparent distress; mood and affect are within normal limits.  An examination of the face, neck, chest, and back was performed and relevant findings are noted below.   Head - Anterior (Face) Face mainly clear   right 2nd finger nail Nail problem    Assessment & Plan   Acne vulgaris Head - Anterior (Face)  Severe and Chronic (present >1 year); currently on Isotretinoin and not to goal (must reach target dose based on weight and also have clear skin for 2 months prior to discontinuation in order to help prevent relapse)  WK 30 IPLEDGE# [id number] 2 forms of birth control- tubal ligation, female latex condoms [facility]   Total mg = 11,500  mg Total mg/kg  =  168 mg/kg Labs reviewed 04/05/21 Cont Absorica 40 mg once a day #30 0rf  Patient with history of depressed mood at higher doses, but doing well at this dose and reporting normal mood at this time, no SI  Urine pregnancy test performed in office today and was negative.  Patient demonstrates comprehension and confirms she will not get pregnant.    Recommend starting Tretinoin cream after Isotretinoin therapy to help decrease pores on face   Related Medications ISOtretinoin (ACCUTANE) 40 MG capsule Take 1 capsule (40 mg total) by mouth daily.  Nail problem right 2nd finger nail  Will evaluate at follow up appt    Xerosis secondary to isotretinoin therapy - Continue emollients as directed  Cheilitis secondary to isotretinoin therapy - Continue lip balm as directed, Dr. Luvenia Heller Cortibalm recommended  Long term medication management (isotretinoin) - While taking Isotretinoin and for 30 days after you finish the medication, do not get pregnant, do not share pills, do not donate blood. Isotretinoin is best absorbed when taken with a fatty meal. Isotretinoin can make you sensitive to the sun. Daily careful sun protection including sunscreen SPF 30+ when outdoors is recommended.  Follow-up in 30 days.   I, Marye Round, CMA, am acting as scribe for Forest Gleason, MD .   Documentation: I have reviewed the above documentation for accuracy and completeness,  and I agree with the above.  Forest Gleason, MD

## 2021-07-20 NOTE — Patient Instructions (Signed)

## 2021-07-21 ENCOUNTER — Other Ambulatory Visit
Admission: RE | Admit: 2021-07-21 | Discharge: 2021-07-21 | Disposition: A | Discharge status: Home / Self Care | Payer: 59 | Source: Ambulatory Visit | Attending: Surgery | Admitting: Surgery

## 2021-07-21 ENCOUNTER — Encounter: Payer: Self-pay | Admitting: Dermatology

## 2021-07-21 NOTE — Patient Instructions (Addendum)
Your procedure is scheduled on: Tuesday August 03, 2021. Su procedimiento est programado para: Martes Worden 2022. Report to Day Surgery inside [facility] 2nd floor. Presntese a: Science writer del [facility] 2ndo piso. To find out your arrival time please call [phone] between 1PM - 3PM on Monday August 02, 2021. Para saber su hora de llegada por favor llame al [phone] entre la 1PM - 3PM el da: L[address] [place] 2022.   Remember: Instructions that are not followed completely may result in serious medical risk, up to and including death,  or upon the discretion of your surgeon and anesthesiologist your surgery may need to be rescheduled.  Recuerde: Las instrucciones que no se siguen completamente Heritage manager en un riesgo de salud grave, incluyendo hasta  la [place] o a discrecin de su cirujano y Environmental health practitioner, su ciruga se puede posponer.   __X_ 1.Do not eat food after midnight the night before your procedure. No    gum chewing or hard candies. You may drink clear liquids up to 2 hours     before you are scheduled to arrive for your surgery- DO not drink clear     Liquids within 2 hours of the start of your surgery.     Clear Liquids include:    water, apple juice without pulp, clear carbohydrate drink such as    Clearfast of Gartorade, Black Coffee or Tea (Do not add anything to coffee or tea).      No coma nada despus de la medianoche de la noche anterior a su    procedimiento. No coma chicles ni caramelos duros. Puede tomar    lquidos claros hasta 2 horas antes de su hora programada de llegada al     hospital para su procedimiento. No tome lquidos claros durante el     transcurso de las 2 horas de su llegada programada al hospital para su     procedimiento, ya que esto puede llevar a que su procedimiento se    retrase o tenga que volver a Health and safety inspector.  Los lquidos claros incluyen:          - Agua o jugo de [place] sin  pulpa          - Bebidas claras con carbohidratos como ClearFast o Gatorade          - Caf negro o t claro (sin leche, sin cremas, no agregue nada al caf ni al t)  No tome nada que no est en esta lista.  Los pacientes con diabetes tipo 1 y tipo 2 solo deben Agricultural engineer.  Llame a la clnica de [facility] o a la unidad de Same Day Surgery si  tiene alguna pregunta sobre estas instrucciones.              _X__ 2.Do Not Smoke or use e-cigarettes For 24 Hours Prior to Your Surgery.    Do not use any chewable tobacco products for at least 6   hours prior to surgery.    No fume ni use cigarrillos electrnicos durante las 24 horas previas    a su [place].  No use ningn producto de tabaco masticable durante   al menos 6 horas antes de la [place].     __X_ 3. No alcohol for 24 hours before or after surgery.    No tome alcohol durante las 24 horas antes ni despus de la [place].   __X__4. On the morning of surgery brush your teeth  with toothpaste and water, you                may rinse your mouth with mouthwash if you wish.  Do not swallow any toothpaste of mouthwash.   En la maana de la [place], cepllese los dientes con pasta de dientes y [place],                [place] enjuagarse la boca con enjuague bucal si lo desea. No ingiera ninguna pasta de dientes o enjuague bucal.   __X__ 5. Notify your doctor if there is any change in your medical condition (cold,fever, infections).    Informe a su mdico si hay algn cambio en su condicin mdica  (resfriado, fiebre, infecciones).   Do not wear jewelry, make-up, hairpins, clips or nail polish.  No use joyas, maquillajes, pinzas/ganchos para el cabello ni esmalte de uas.  Do not wear lotions, powders, or perfumes. You may wear deodorant.  No use lociones, polvos o perfumes.  Puede usar desodorante.    Do not shave 48 hours prior to surgery. Men may shave face and neck.  No se afeite 48 horas antes de la [place].  Los hombres pueden [employer] cara   y el cuello.   Do not bring valuables to the hospital.   No lleve objetos [facility] is not responsible for any belongings or valuables.  [facility] no se hace responsable de ningn tipo de pertenencias u objetos de Geographical information systems officer.               Contacts, dentures or bridgework may not be worn into surgery.  Los lentes de [place], las dentaduras postizas o puentes no se pueden usar en la [place].   Leave your suitcase in the car. After surgery it may be brought to your room.  Deje su maleta en el auto.  Despus de la ciruga podr traerla a su habitacin.   For patients admitted to the hospital, discharge time is determined by your  treatment team.  Para los pacientes que sean ingresados al hospital, el tiempo en el cual se le  dar de alta es determinado por su equipo de [place].   Patients discharged the day of surgery will not be allowed to drive home. A los pacientes que se les da de alta el mismo da de la ciruga no se les permitir conducir a Holiday representative.    __X__ Take these medicines the morning of surgery with A SIP OF WATER:          [employer] estas medicinas la maana de la ciruga con UN SORBO DE AGUA:  1. None   2.   3.   4.       5.  6.  ____ Fleet Enema (as directed)          Enema de Fleet (segn lo indicado)    __X__ Use CHG Soap as directed          Utilice el jabn de CHG segn lo indicado  ____ Use inhalers on the day of surgery          Use los inhaladores el da de la ciruga  ____ Stop metformin 2 days prior to surgery          Deje de tomar el metformin 2 das antes de la ciruga    ____ Take 1/2 of usual insulin dose the night before surgery and none on the morning of surgery  Tome la mitad de la dosis habitual de insulina la noche antes de la [place] y no tome nada en la maana de la             ciruga  ____ Stop Coumadin/Plavix/aspirin on           Deje de tomar el Coumadin/Plavix/aspirina el da:  __X__ Stop  Anti-inflammatories on such as Ibuprofen, Aleve, Advil, naproxen, Midol, aspirin and aspirin containing products like Goody's and BC powders.          Deje de tomar antiinflamatorios como Ibuprofen, Aleve, Advil, naproxen, Midol, aspirin and aspirin containing products like Goody's and BC powders.   __X__ Stop supplements until after surgery            Deje de tomar suplementos hasta despus de la ciruga  ____ Bring C-Pap to the hospital          [place] al hospital

## 2021-07-26 ENCOUNTER — Other Ambulatory Visit: Payer: Self-pay

## 2021-07-26 ENCOUNTER — Ambulatory Visit: Payer: 59

## 2021-07-26 DIAGNOSIS — R278 Other lack of coordination: Secondary | ICD-10-CM | POA: Diagnosis not present

## 2021-07-26 DIAGNOSIS — M6281 Muscle weakness (generalized): Secondary | ICD-10-CM

## 2021-07-26 DIAGNOSIS — N3946 Mixed incontinence: Secondary | ICD-10-CM

## 2021-07-26 DIAGNOSIS — M5440 Lumbago with sciatica, unspecified side: Secondary | ICD-10-CM

## 2021-07-26 NOTE — Therapy (Signed)
[place] MAIN [facility] SERVICES [address] [place], [place], [address] Phone: [phone]   Fax:  [phone]  Physical Therapy Treatment  Patient Details  Name: Lynn Carroll MRN: [id number] Date of Birth: 07-Jul-1981 Referring Provider (PT): Dr. Benjaman Kindler   Encounter Date: 07/26/2021   PT End of Session - 07/26/21 1008     Visit Number 4    Number of Visits 10    Date for PT Re-Evaluation 08/31/21    Authorization Type [employer]: 30 PT/OT visits combined, 14 used, 16 left    Authorization - Visit Number 4    Authorization - Number of Visits 16    Progress Note Due on Visit 10    PT Start Time 0905   pt late   PT Stop Time 1001    PT Time Calculation (min) 56 min    Activity Tolerance Patient tolerated treatment well    Behavior During Therapy [facility] for tasks assessed/performed             Past Medical History:  Diagnosis Date   Acne    Anxiety    Dermatitis    Nasal congestion     Past Surgical History:  Procedure Laterality Date   ANAL FISSURE REPAIR N/A 04/03/2020   Procedure: ANAL FISSURE REPAIR w/ botox;  Surgeon: Benjamine Sprague, DO;  Location: [facility] ORS;  Service: General;  Laterality: N/A;   TUBAL LIGATION      There were no vitals filed for this visit.   Subjective Assessment - 07/26/21 0908     Subjective Pt reported she been doing ok since last visit. 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 She was told her neck is straight and needs a curve as well. Pt is having L knee surgery on 11/29 and needs to cancel her appt. next week 2/2 arthoscopy for meniscus repair. Her precautions will be rest and no work for two weeks. Pt reported she hasn't really paid attention of how often she's leaking but she will keep track of it. She reported practicing correct body mechanics at work has been helping to reduce  pain-especially keeping items close to her body. Pt reported pain with intercourse has decr. a little bit.    Patient is accompained by: Interpreter   Estill Bamberg   Pertinent History B plantar fasciitis, irregular menses, allergies, anxiety, chronic LBP with sciatica, dermatitis    How long can you sit comfortably? has to mindful of posture but sitting doesn't really cause pain    Patient Stated Goals No pain during sex, to learn how to tighten the muscles around the bladder and stomach to reduce leakage.    Currently in Pain? Yes    Pain Score 4     Pain Location Neck    Pain Orientation Other (Comment)   central   Pain Descriptors / Indicators Discomfort    Pain Type Chronic pain   pt reported she's had it for a long time   Pain Onset More than a month ago    Pain Frequency Intermittent    Aggravating Factors  stress, R and L rotation    Pain Relieving Factors Ice and tylenol                     NMR: Access Code: [email or web address] URL: [email or web address] Date: 07/26/2021 Prepared by: Geoffry Paradise  Exercises  Supine Pelvic Tilt - 1 x daily - 7 x weekly - 3 sets - 10 reps Supine Diaphragmatic Breathing - 1 x daily - 7 x weekly - 1 sets - 5 reps - 2 hold Child's Pose Stretch - 1 x daily - 7 x weekly - 1 sets - 3 reps - 30-60 hold  Patient Education Medical illustrator  Cues and demo for technique for each activity. Performed with S for safety. Pt would benefit from wearing pants that do not constrict diaphragm during breathing.  Incr. Ant. Pelvic tilt in stance noted. Incr. Postural sway during R and L SLS (more L SLS).                 SELF CARE:  PT Education - 07/26/21 1004     Education Details PT reviewed STGs progress, FOTO score meaning, and POC. PT educated pt on not to use ice for greater than 20 minutes 2/2 vasodilation after 20 minutes which incr. inflammation vs. reducing it. PT educated pt on chronic LBP can also be  from pelvic floor issues and that as a pelvic health PT we focus on the entire kinetic chain as it can impact the pelvic floor-pt is seeing a chiropractor for pain. PT educated pt that she will need to speak with surgeon re: precautions s/p L knee arthoscopy on 11/29 but that it will likely be based on pain tolerance. PT reiterated the importance of proper body mechanics at work and at [employer] rotating spine. PT educated pt on the importance of relaxation exercises to reduce PFM tension, as she's not been performing breathing (and to wear clothing that doesn't restrict diaphragm). PT educated pt on the importance of using non scented tampons/pads and not to use douche products as they disrupt vaginal pH and vagina cleans itself.              PT Short Term Goals - 07/26/21 0926       PT SHORT TERM GOAL #1   Title Pt will be IND in HEP in order to improve pain, posture, balance, and strength.    Time 4    Period Weeks    Status Partially Met    Target Date 07/20/21      PT SHORT TERM GOAL #2   Title Complete FOTO and write goal as indicated.    Time 4    Period Weeks    Status Achieved    Target Date 07/20/21      PT SHORT TERM GOAL #3   Title Pt will demonstrate proper alignment and improve strength of musculature surrounding pelvis in order to decr. pain by 1 point or greater during work.    Baseline 07/26/21: pt reported pain at work has improved a little bit, prior to PT it was 7-8/10 and now it is: 7/10. L hip abd: 3+/5, L hip add: 3+/5, R hip abd: 4-/5, R hip add: 4-/5.    Time 4    Period Weeks    Status Partially Met               PT Long Term Goals - 07/12/21 1439       PT LONG TERM GOAL #1   Title Pt will demonstrate proper PFM coordination in order to insert tampon, participate in intercourse and OBGYN without pelvic pain.    Time 8    Period Weeks    Status New      PT LONG TERM GOAL #2   Title  Pt will demonstrate proper posture and alignment in stance in  order to stand for 30+ minutes without incr. in pain in order to perform work tasks.    Time 8    Period Weeks    Status New      PT LONG TERM GOAL #3   Title Pt will demonstrate proper PFM contraction in order to report no SUI or UUI over the last 4 weeks.    Time 10    Period Weeks    Status New      PT LONG TERM GOAL #4   Title Pt will be able to perform R and L SLS for >10 sec. without UE support or LOB in order to improve safety during all functional mobility tasks.    Time 10    Period Weeks    Status New      PT LONG TERM GOAL #5   Title Pt will demonstrate proper posture during strength training exercises in order to perform gym activities without incr. in pain.    Time 10    Period Weeks    Status New      Additional Long Term Goals   Additional Long Term Goals Yes      PT LONG TERM GOAL #6   Title Pt will improve FOTO Urinary problems score from 50 to 60 to improve QOL.    Baseline 50    Time 10    Period Weeks    Status New    Target Date 08/31/21                   Plan - 07/26/21 1009     Clinical Impression Statement Pt demonstrating progress as she met STG 2 and partially met STGs 1 and 3. Pt continues to experience incr. ant. pelvic tilt in stance and decr. post. pelvic tilt during R SLS, PT will reassess SI joint next session and treat as indicated. Pt's B hip musculature has improved slightly but is still weak overall, PT will begin hip strengthening next session. Today's skilled session focused on extensive education for pt re: body mechanics, the importance of performing activities to relax pelvic floor, as tension in pelvic floor can contribute to back pain and pelvic pain. Pt would continue to benefit from skilled PT to improve SUI, pelvic pain, strength, posture, and balance during SLS activities.    Examination-Activity Limitations Bend;Lift;Caring for Others;Carry;Continence;Toileting;Squat;Transfers;Stand;Locomotion Level    PT  Treatment/Interventions ADLs/Self Care Home Management;Biofeedback;Gait training;Stair training;Functional mobility training;Therapeutic activities;Neuromuscular re-education;Balance training;Therapeutic exercise;Patient/family education;Manual techniques;Taping;Joint Manipulations    PT Next Visit Plan Internal muscle assessent. B clamshells and check SI joint (R blocked). Palpate adductors and back-MT prn. Review HEP prn.    Consulted and Agree with Plan of Care Patient             Patient will benefit from skilled therapeutic intervention in order to improve the following deficits and impairments:  Abnormal gait, Decreased balance, Decreased mobility, Hypomobility, Postural dysfunction, Pain, Impaired flexibility, Decreased strength, Decreased coordination, Decreased range of motion  Visit Diagnosis: Other lack of coordination  Muscle weakness (generalized)  Low back pain with sciatica, sciatica laterality unspecified, unspecified back pain laterality, unspecified chronicity  Mixed incontinence     Problem List Patient Active Problem List   Diagnosis Date Noted   Anxiety reaction 03/11/2020   Other hemorrhoids 12/31/2019   Bilateral plantar fasciitis 10/15/2019   Chronic bilateral low back pain with sciatica 10/01/2019   Irregular menses 10/01/2019  Recurrent infection of skin 10/01/2019   Seasonal allergies 10/01/2019    Stasha Naraine L, PT 07/26/2021, 10:13 AM  [place] MAIN [facility] SERVICES [address] [place], [place], [address] Phone: [phone]   Fax:  [phone]  Name: Lynn Carroll MRN: [id number] Date of Birth: 06-Jun-1981   Geoffry Paradise, PT,DPT 07/26/21 10:14 AM Phone: [phone] Fax: [phone]

## 2021-07-26 NOTE — Patient Instructions (Signed)
Access Code: [email or web address] URL: [email or web address] Date: 07/26/2021 Prepared by: Geoffry Paradise  Exercises Supine Pelvic Tilt - 1 x daily - 7 x weekly - 3 sets - 10 reps Supine Diaphragmatic Breathing - 1 x daily - 7 x weekly - 1 sets - 5 reps - 2 hold Child's Pose Stretch - 1 x daily - 7 x weekly - 1 sets - 3 reps - 30-60 hold  Patient Education Household Activities Lifting Techniques

## 2021-08-03 ENCOUNTER — Encounter: Payer: Self-pay | Admitting: Surgery

## 2021-08-03 ENCOUNTER — Other Ambulatory Visit: Payer: Self-pay

## 2021-08-03 ENCOUNTER — Ambulatory Visit
Admission: RE | Admit: 2021-08-03 | Discharge: 2021-08-03 | Disposition: A | Discharge status: Home / Self Care | Payer: 59 | Attending: Surgery | Admitting: Surgery

## 2021-08-03 ENCOUNTER — Ambulatory Visit: Payer: 59

## 2021-08-03 ENCOUNTER — Encounter
Admission: RE | Disposition: A | Discharge status: Home / Self Care | Payer: Self-pay | Source: Home / Self Care | Attending: Surgery

## 2021-08-03 DIAGNOSIS — M1732 Unilateral post-traumatic osteoarthritis, left knee: Secondary | ICD-10-CM | POA: Insufficient documentation

## 2021-08-03 DIAGNOSIS — S83232A Complex tear of medial meniscus, current injury, left knee, initial encounter: Secondary | ICD-10-CM | POA: Insufficient documentation

## 2021-08-03 DIAGNOSIS — W19XXXA Unspecified fall, initial encounter: Secondary | ICD-10-CM | POA: Insufficient documentation

## 2021-08-03 HISTORY — PX: KNEE ARTHROSCOPY: SHX127

## 2021-08-03 LAB — POCT PREGNANCY, URINE: Preg Test, Ur: NEGATIVE

## 2021-08-03 SURGERY — ARTHROSCOPY, KNEE
Anesthesia: General | Site: Knee | Laterality: Left

## 2021-08-03 MED ORDER — ACETAMINOPHEN 10 MG/ML IV SOLN
INTRAVENOUS | Status: AC
  Filled 2021-08-03: qty 100

## 2021-08-03 MED ORDER — PHENYLEPHRINE HCL (PRESSORS) 10 MG/ML IV SOLN
INTRAVENOUS | Status: DC | PRN
  Administered 2021-08-03 (×2): 80 ug via INTRAVENOUS
  Administered 2021-08-03: 100 ug via INTRAVENOUS
  Administered 2021-08-03: 40 ug via INTRAVENOUS
  Administered 2021-08-03 (×2): 100 ug via INTRAVENOUS

## 2021-08-03 MED ORDER — LIDOCAINE HCL (PF) 2 % IJ SOLN
INTRAMUSCULAR | Status: AC
  Filled 2021-08-03: qty 5

## 2021-08-03 MED ORDER — SUCCINYLCHOLINE CHLORIDE 200 MG/10ML IV SOSY
PREFILLED_SYRINGE | INTRAVENOUS | Status: AC
  Filled 2021-08-03: qty 10

## 2021-08-03 MED ORDER — OXYCODONE HCL 5 MG PO TABS
5.0000 mg | ORAL_TABLET | Freq: Once | ORAL | Status: AC | PRN
  Administered 2021-08-03: 5 mg via ORAL

## 2021-08-03 MED ORDER — ACETAMINOPHEN 10 MG/ML IV SOLN
1000.0000 mg | Freq: Once | INTRAVENOUS | Status: DC | PRN
  Administered 2021-08-03: 1000 mg via INTRAVENOUS

## 2021-08-03 MED ORDER — LIDOCAINE HCL (CARDIAC) PF 100 MG/5ML IV SOSY
PREFILLED_SYRINGE | INTRAVENOUS | Status: DC | PRN
  Administered 2021-08-03: 100 mg via INTRAVENOUS

## 2021-08-03 MED ORDER — OXYCODONE HCL 5 MG/5ML PO SOLN: 5.0000 mg | Freq: Once | ORAL | Status: DC | PRN

## 2021-08-03 MED ORDER — ONDANSETRON HCL 4 MG/2ML IJ SOLN: 4.0000 mg | Freq: Four times a day (QID) | INTRAMUSCULAR | Status: DC | PRN

## 2021-08-03 MED ORDER — ORAL CARE MOUTH RINSE: 15.0000 mL | Freq: Once | OROMUCOSAL | Status: AC

## 2021-08-03 MED ORDER — PROMETHAZINE HCL 25 MG/ML IJ SOLN: 6.2500 mg | INTRAMUSCULAR | Status: DC | PRN

## 2021-08-03 MED ORDER — OXYCODONE HCL 5 MG/5ML PO SOLN: 5.0000 mg | Freq: Once | ORAL | Status: AC | PRN

## 2021-08-03 MED ORDER — OXYCODONE HCL 5 MG PO TABS: 5.0000 mg | ORAL_TABLET | Freq: Once | ORAL | Status: DC | PRN

## 2021-08-03 MED ORDER — HYDROCODONE-ACETAMINOPHEN 5-325 MG PO TABS
1.0000 | ORAL_TABLET | Freq: Four times a day (QID) | ORAL | 0 refills | Status: DC | PRN
Start: 2021-08-03 — End: 2021-08-27

## 2021-08-03 MED ORDER — FENTANYL CITRATE (PF) 100 MCG/2ML IJ SOLN
INTRAMUSCULAR | Status: DC | PRN
  Administered 2021-08-03: 75 ug via INTRAVENOUS
  Administered 2021-08-03: 25 ug via INTRAVENOUS

## 2021-08-03 MED ORDER — PROPOFOL 10 MG/ML IV BOLUS
INTRAVENOUS | Status: DC | PRN
  Administered 2021-08-03: 200 mg via INTRAVENOUS

## 2021-08-03 MED ORDER — ONDANSETRON HCL 4 MG/2ML IJ SOLN
INTRAMUSCULAR | Status: DC | PRN
  Administered 2021-08-03: 4 mg via INTRAVENOUS

## 2021-08-03 MED ORDER — OXYCODONE HCL 5 MG PO TABS
ORAL_TABLET | ORAL | Status: AC
  Filled 2021-08-03: qty 1

## 2021-08-03 MED ORDER — MIDAZOLAM HCL 2 MG/2ML IJ SOLN
INTRAMUSCULAR | Status: AC
  Filled 2021-08-03: qty 2

## 2021-08-03 MED ORDER — LACTATED RINGERS IV SOLN: INTRAVENOUS | Status: DC

## 2021-08-03 MED ORDER — CEFAZOLIN SODIUM-DEXTROSE 2-4 GM/100ML-% IV SOLN
2.0000 g | INTRAVENOUS | Status: AC
Start: 2021-08-03 — End: 2021-08-03
  Administered 2021-08-03: 2 g via INTRAVENOUS

## 2021-08-03 MED ORDER — ACETAMINOPHEN 10 MG/ML IV SOLN: 1000.0000 mg | Freq: Once | INTRAVENOUS | Status: DC | PRN

## 2021-08-03 MED ORDER — PROPOFOL 10 MG/ML IV BOLUS
INTRAVENOUS | Status: AC
  Filled 2021-08-03: qty 40

## 2021-08-03 MED ORDER — OXYCODONE HCL 5 MG PO TABS
5.0000 mg | ORAL_TABLET | Freq: Once | ORAL | Status: AC
  Administered 2021-08-03: 5 mg via ORAL

## 2021-08-03 MED ORDER — DEXAMETHASONE SODIUM PHOSPHATE 10 MG/ML IJ SOLN
INTRAMUSCULAR | Status: DC | PRN
  Administered 2021-08-03: 10 mg via INTRAVENOUS

## 2021-08-03 MED ORDER — ROCURONIUM BROMIDE 10 MG/ML (PF) SYRINGE
PREFILLED_SYRINGE | INTRAVENOUS | Status: AC
  Filled 2021-08-03: qty 10

## 2021-08-03 MED ORDER — DROPERIDOL 2.5 MG/ML IJ SOLN
0.6250 mg | Freq: Once | INTRAMUSCULAR | Status: DC | PRN
  Filled 2021-08-03: qty 2

## 2021-08-03 MED ORDER — RINGERS IRRIGATION IR SOLN
Status: DC | PRN
  Administered 2021-08-03: 1500 mL

## 2021-08-03 MED ORDER — FAMOTIDINE 20 MG PO TABS
20.0000 mg | ORAL_TABLET | Freq: Once | ORAL | Status: AC
  Administered 2021-08-03: 20 mg via ORAL

## 2021-08-03 MED ORDER — FENTANYL CITRATE (PF) 100 MCG/2ML IJ SOLN
25.0000 ug | INTRAMUSCULAR | Status: DC | PRN
  Administered 2021-08-03: 25 ug via INTRAVENOUS

## 2021-08-03 MED ORDER — BUPIVACAINE-EPINEPHRINE (PF) 0.5% -1:200000 IJ SOLN: INTRAMUSCULAR | Status: DC | PRN

## 2021-08-03 MED ORDER — FENTANYL CITRATE (PF) 100 MCG/2ML IJ SOLN
INTRAMUSCULAR | Status: AC
  Filled 2021-08-03: qty 2

## 2021-08-03 MED ORDER — LACTATED RINGERS IV SOLN: INTRAVENOUS | Status: DC | PRN

## 2021-08-03 MED ORDER — FENTANYL CITRATE (PF) 100 MCG/2ML IJ SOLN: 25.0000 ug | INTRAMUSCULAR | Status: DC | PRN

## 2021-08-03 MED ORDER — METOCLOPRAMIDE HCL 5 MG/ML IJ SOLN: 5.0000 mg | Freq: Three times a day (TID) | INTRAMUSCULAR | Status: DC | PRN

## 2021-08-03 MED ORDER — GABAPENTIN 300 MG PO CAPS
300.0000 mg | ORAL_CAPSULE | Freq: Once | ORAL | Status: AC
  Administered 2021-08-03: 300 mg via ORAL

## 2021-08-03 MED ORDER — CHLORHEXIDINE GLUCONATE 0.12 % MT SOLN
15.0000 mL | Freq: Once | OROMUCOSAL | Status: AC
  Administered 2021-08-03: 15 mL via OROMUCOSAL

## 2021-08-03 MED ORDER — GLYCOPYRROLATE 0.2 MG/ML IJ SOLN
INTRAMUSCULAR | Status: AC
  Filled 2021-08-03: qty 1

## 2021-08-03 MED ORDER — MIDAZOLAM HCL 2 MG/2ML IJ SOLN
INTRAMUSCULAR | Status: DC | PRN
  Administered 2021-08-03: 2 mg via INTRAVENOUS

## 2021-08-03 MED ORDER — SODIUM CHLORIDE 0.9 % IV SOLN: INTRAVENOUS | Status: DC

## 2021-08-03 MED ORDER — HYDROCODONE-ACETAMINOPHEN 5-325 MG PO TABS: 1.0000 | ORAL_TABLET | ORAL | Status: DC | PRN

## 2021-08-03 MED ORDER — 0.9 % SODIUM CHLORIDE (POUR BTL) OPTIME
TOPICAL | Status: DC | PRN
  Administered 2021-08-03: 1000 mL

## 2021-08-03 MED ORDER — METOCLOPRAMIDE HCL 10 MG PO TABS: 5.0000 mg | ORAL_TABLET | Freq: Three times a day (TID) | ORAL | Status: DC | PRN

## 2021-08-03 MED ORDER — ONDANSETRON HCL 4 MG PO TABS: 4.0000 mg | ORAL_TABLET | Freq: Four times a day (QID) | ORAL | Status: DC | PRN

## 2021-08-03 MED ORDER — DEXMEDETOMIDINE (PRECEDEX) IN NS 20 MCG/5ML (4 MCG/ML) IV SYRINGE
PREFILLED_SYRINGE | INTRAVENOUS | Status: DC | PRN
  Administered 2021-08-03: 8 ug via INTRAVENOUS
  Administered 2021-08-03: 4 ug via INTRAVENOUS
  Administered 2021-08-03: 8 ug via INTRAVENOUS

## 2021-08-03 MED ORDER — LIDOCAINE HCL (PF) 1 % IJ SOLN
INTRAMUSCULAR | Status: DC | PRN
  Administered 2021-08-03: 60 mL

## 2021-08-03 MED ORDER — DEXAMETHASONE SODIUM PHOSPHATE 10 MG/ML IJ SOLN
INTRAMUSCULAR | Status: AC
  Filled 2021-08-03: qty 1

## 2021-08-03 MED ORDER — GLYCOPYRROLATE 0.2 MG/ML IJ SOLN
INTRAMUSCULAR | Status: DC | PRN
  Administered 2021-08-03: .2 mg via INTRAVENOUS

## 2021-08-03 MED ORDER — PHENYLEPHRINE HCL-NACL 20-0.9 MG/250ML-% IV SOLN
INTRAVENOUS | Status: AC
  Filled 2021-08-03: qty 250

## 2021-08-03 MED ORDER — ONDANSETRON HCL 4 MG/2ML IJ SOLN
INTRAMUSCULAR | Status: AC
  Filled 2021-08-03: qty 2

## 2021-08-03 SURGICAL SUPPLY — 44 items
APL PRP STRL LF DISP 70% ISPRP (MISCELLANEOUS) ×1
BAG COUNTER SPONGE SURGICOUNT (BAG) IMPLANT
BAG SPNG CNTER NS LX DISP (BAG)
BLADE FULL RADIUS 3.5 (BLADE) ×2 IMPLANT
BLADE SHAVER 4.5X7 STR FR (MISCELLANEOUS) ×2 IMPLANT
BNDG ELASTIC 6X5.8 VLCR STR LF (GAUZE/BANDAGES/DRESSINGS) ×2 IMPLANT
BNDG ESMARK 6X12 TAN STRL LF (GAUZE/BANDAGES/DRESSINGS) ×2 IMPLANT
CATH ROBINSON RED A/P 10FR (CATHETERS) ×2 IMPLANT
CHLORAPREP W/TINT 26 (MISCELLANEOUS) ×2 IMPLANT
COLLECTOR GRAFT TISSUE (SYSTAGENIX WOUND MANAGEMENT) ×2
CUFF TOURN SGL QUICK 24 (TOURNIQUET CUFF)
CUFF TOURN SGL QUICK 34 (TOURNIQUET CUFF)
CUFF TRNQT CYL 24X4X16.5-23 (TOURNIQUET CUFF) IMPLANT
CUFF TRNQT CYL 34X4.125X (TOURNIQUET CUFF) IMPLANT
DRAPE ARTHRO LIMB 89X125 STRL (DRAPES) ×2 IMPLANT
DRAPE IMP U-DRAPE 54X76 (DRAPES) ×2 IMPLANT
ELECT REM PT RETURN 9FT ADLT (ELECTROSURGICAL) ×2
ELECTRODE REM PT RTRN 9FT ADLT (ELECTROSURGICAL) ×1 IMPLANT
GAUZE SPONGE 4X4 12PLY STRL (GAUZE/BANDAGES/DRESSINGS) ×2 IMPLANT
GLOVE SURG ENC MOIS LTX SZ8 (GLOVE) ×2 IMPLANT
GLOVE SURG ENC TEXT LTX SZ7 (GLOVE) ×2 IMPLANT
GLOVE SURG UNDER LTX SZ8 (GLOVE) ×2 IMPLANT
GLOVE SURG UNDER POLY LF SZ7.5 (GLOVE) ×2 IMPLANT
GOWN STRL REUS W/ TWL LRG LVL3 (GOWN DISPOSABLE) ×1 IMPLANT
GOWN STRL REUS W/ TWL XL LVL3 (GOWN DISPOSABLE) ×1 IMPLANT
GOWN STRL REUS W/TWL LRG LVL3 (GOWN DISPOSABLE) ×2
GOWN STRL REUS W/TWL XL LVL3 (GOWN DISPOSABLE) ×2
IV LACTATED RINGER IRRG 3000ML (IV SOLUTION) ×2
IV LR IRRIG 3000ML ARTHROMATIC (IV SOLUTION) ×1 IMPLANT
KIT TURNOVER KIT A (KITS) ×2 IMPLANT
MANIFOLD NEPTUNE II (INSTRUMENTS) ×4 IMPLANT
NEEDLE HYPO 21X1.5 SAFETY (NEEDLE) ×2 IMPLANT
PACK ARTHROSCOPY KNEE (MISCELLANEOUS) ×2 IMPLANT
PENCIL ELECTRO HAND CTR (MISCELLANEOUS) ×2 IMPLANT
SPONGE T-LAP 18X18 ~~LOC~~+RFID (SPONGE) ×2 IMPLANT
SUT PROLENE 4 0 PS 2 18 (SUTURE) ×2 IMPLANT
SUT TICRON COATED BLUE 2 0 30 (SUTURE) IMPLANT
SYR 50ML LL SCALE MARK (SYRINGE) ×2 IMPLANT
SYR TOOMEY IRRIG 70ML (MISCELLANEOUS) ×2
SYRINGE TOOMEY IRRIG 70ML (MISCELLANEOUS) ×1 IMPLANT
TISSUE GRAFT COLLECTOR (SYSTAGENIX WOUND MANAGEMENT) ×1 IMPLANT
TUBING INFLOW SET DBFLO PUMP (TUBING) ×2 IMPLANT
WAND WEREWOLF FLOW 90D (MISCELLANEOUS) ×2 IMPLANT
WATER STERILE IRR 500ML POUR (IV SOLUTION) IMPLANT

## 2021-08-03 NOTE — Anesthesia Preprocedure Evaluation (Addendum)
Anesthesia Evaluation  Patient identified by MRN, date of birth, ID band Patient awake    Reviewed: Allergy & Precautions, NPO status , Patient's Chart, lab work & pertinent test results  Airway Mallampati: II       Dental  (+) Teeth Intact   Pulmonary neg pulmonary ROS,    Pulmonary exam normal        Cardiovascular Exercise Tolerance: Good negative cardio ROS Normal cardiovascular exam     Neuro/Psych PSYCHIATRIC DISORDERS Anxiety  Neuromuscular disease (lower back pain with sciatica)    GI/Hepatic negative GI ROS, Neg liver ROS,   Endo/Other  negative endocrine ROS  Renal/GU negative Renal ROS  negative genitourinary   Musculoskeletal negative musculoskeletal ROS (+)   Abdominal Normal abdominal exam  (+)   Peds negative pediatric ROS (+)  Hematology negative hematology ROS (+)   Anesthesia Other Findings Complex tear of medial meniscus of left knee as current injury  Reproductive/Obstetrics negative OB ROS                            Anesthesia Physical  Anesthesia Plan  ASA: II  Anesthesia Plan: General   Post-op Pain Management:    Induction: Intravenous  PONV Risk Score and Plan: 3 and Ondansetron, Dexamethasone and Midazolam  Airway Management Planned: LMA  Additional Equipment:   Intra-op Plan:   Post-operative Plan: Extubation in OR  Informed Consent: I have reviewed the patients History and Physical, chart, labs and discussed the procedure including the risks, benefits and alternatives for the proposed anesthesia with the patient or authorized representative who has indicated his/her understanding and acceptance.     Dental advisory given  Plan Discussed with: CRNA and Anesthesiologist  Anesthesia Plan Comments:        Anesthesia Quick Evaluation

## 2021-08-03 NOTE — Anesthesia Procedure Notes (Signed)
Procedure Name: LMA Insertion Date/Time: 08/03/2021 1:30 PM Performed by: Demetrius Charity, CRNA Pre-anesthesia Checklist: Patient identified, Patient being monitored, Timeout performed, Emergency Drugs available and Suction available Patient Re-evaluated:Patient Re-evaluated prior to induction Oxygen Delivery Method: Circle system utilized Preoxygenation: Pre-oxygenation with 100% oxygen Induction Type: IV induction Ventilation: Mask ventilation without difficulty LMA: LMA inserted LMA Size: 4.0 Tube type: Oral Number of attempts: 1 Placement Confirmation: positive ETCO2 and breath sounds checked- equal and bilateral Tube secured with: Tape Dental Injury: Teeth and Oropharynx as per pre-operative assessment

## 2021-08-03 NOTE — Discharge Instructions (Addendum)
Orthopedic discharge instructions: Keep dressing dry and intact.  May shower after dressing changed on post-op day #4 (Saturday).  Cover sutures with Band-Aids after drying off. Apply ice frequently to knee. Take pain medication as prescribed or ES Tylenol when needed.  May weight-bear as tolerated - use crutches or walker as needed. Follow-up in 10-14 days or as scheduled.     CIRUGIA AMBULATORIA       Instruccionnes de alta    Date (Fecha)    1.  Las drogas que se Statistician en su cuerpo [employer], asi que por las proximas 24 horas usted no debe:   Conducir Scientist, research (medical)) un automovil   Hacer ninguna decision legal   Tomar ninguna bebida alcoholica  2.  A) [place] puede comenzar una dieta regular.  Es mejor que hoy empiece con liquidos y gradualmente anada comidas solidas.       B) Puede comer cualquier comida que desee pero es mejor empezar con liquidos, luego sopitas con galletas saladas y gradualmente llegar a las comidas solidas.  3.  Por favor avise a su medico inmediatamente si usted tiene algun sangrado anormal, tiene dificultad con la respiracion, enrojecimiento y Social research officer, government en el sitio de la cirugia, [place], fiebro o dolor que se alivia con [place].  4.  A) Su visita posoperatoria (despues de su operacion) es con el  Dr.   Date                     Time         B)  Por favor llame para hacer la cita posoperatoria.  5.  Istrucciones especificas :

## 2021-08-03 NOTE — Op Note (Signed)
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  Postoperative diagnosis:   Same  Procedure:   Arthroscopic extensive arthroscopic debridement with partial lateral meniscectomy and injection of harvested infrapatellar fat cells, left knee.  Surgeon:   Pascal Lux, MD  Anesthesia:   General LMA  Findings:   As above. There were extensive grade 4 chondromalacial changes involving the weightbearing portions of the lateral tibial plateau and lateral femoral condyle. There were grade 1 chondromalacial changes involving the inferior portion of the patellar ridge. The femoral trochlea and medial compartment all were in satisfactory condition. The medial meniscus and cruciate ligaments also were in excellent condition.  Complications:   None  EBL:   2 cc.  Total fluids:   700 cc of crystalloid.  Tourniquet time:   None  Drains:   None  Closure:   4-0 Prolene interrupted sutures.  Brief clinical note:   The patient is a 40 year old female with a long history of gradually worsening left knee pain following a fall which occurred while she was pregnant. Her symptoms have worsened over the past 1 to 2 years. Her symptoms are aggravated by prolonged standing or ambulation as well as with ascending/descending stairs. Her history and examination are consistent with a lateral meniscus tear and underlying degenerative joint disease, all of which were confirmed by preoperative MRI scan. The patient presents at this time for arthroscopy, debridement, and partial lateral meniscectomy.  Procedure:   The patient was brought into the operating room and lain in the supine position. After adequate general laryngeal mask anesthesia was obtained, a timeout was performed to verify the appropriate side. The patient's left knee was injected sterilely using a solution of 30 cc of 1% lidocaine and 30  cc of 0.5% Sensorcaine with epinephrine. The left lower extremity was prepped with ChloraPrep solution before being draped sterilely. Preoperative antibiotics were administered. The expected portal sites were injected with 0.5% Sensorcaine with epinephrine before the camera was placed in the anterolateral portal and instrumentation performed through the anteromedial portal.   The knee was sequentially examined beginning in the suprapatellar pouch, then progressing to the patellofemoral space, the medial gutter and compartment, the notch, and finally the lateral compartment and gutter. The findings were as described above. Abundant reactive synovial tissues anteriorly were debrided using the full-radius resector in order to improve visualization. Much of the infrapatellar fat pad tissues were harvested using the Arthrex graft net and collected for reimplantation later. The degenerative portions of the complex tear involving the posterior lateral portion of the lateral meniscus were debrided back to stable margins using combination of the small baskets and full-radius resector. Probing of the remaining rim demonstrated good stability. The medial meniscus was stable to probing as well. The instruments were removed from the joint after suctioning the excess fluid.  Prior to withdrawing the cannula for the camera, the harvested fat cells were reinjected into the joint through a #10 red rubber catheter to simulate an adipose stem cell procedure.  The portal sites were closed using 4-0 Prolene interrupted sutures before a sterile bulky dressing was applied to the knee. The patient was then awakened, extubated, and returned to the recovery room in satisfactory condition after tolerating the procedure well.

## 2021-08-03 NOTE — Transfer of Care (Signed)
Immediate Anesthesia Transfer of Care Note  Patient: Veatrice Eckstein  Procedure(s) Performed: LEFT KNEE ARTHROSCOPY WITH DEBRIDEMENT AND REPAIR VERSUS PARTIAL MEDIAL MENISCECTOMY. (Left: Knee)  Patient Location: PACU  Anesthesia Type:General  Level of Consciousness: awake, alert  and oriented  Airway & Oxygen Therapy: Patient Spontanous Breathing and Patient connected to face mask oxygen  Post-op Assessment: Report given to RN and Post -op Vital signs reviewed and stable  Post vital signs: Reviewed and stable  Last Vitals:  Vitals Value Taken Time  BP 101/61 08/03/21 1435  Temp    Pulse 74 08/03/21 1440  Resp 15 08/03/21 1440  SpO2 100 % 08/03/21 1440  Vitals shown include unvalidated device data.  Last Pain:  Vitals:   08/03/21 1243  TempSrc: Temporal  PainSc: 3          Complications: No notable events documented.

## 2021-08-03 NOTE — H&P (Signed)
History of Present Illness: Lynn Carroll is a 40 y.o.female who is being seen for a new patient visit for left knee pain. The symptoms began about 8 years ago and developed as result of a fall while she was pregnant. She did not seek treatment at the time. Over the past 1.5-2 years, she has noted increased pain in her knee, prompting her to make this appointment. She reports 8/10 pain. The pain is located along the medial and posterior aspect of the knee. The pain is described as aching, stabbing and throbbing. The symptoms are aggravated bending, using stairs, at higher levels of activity, walking, standing and standing pivot. She also describes an intermittent catching in her knee. She has associated swelling and no deformity. She has tried acetaminophen, over-the-counter medications and ice with limited benefit. She works Education administrator houses for living.  Current Outpatient Medications:  ABSORICA 40 mg capsule Take 40 mg by mouth 2 (two) times daily   cetirizine (ZYRTEC) 10 mg capsule Take 10 mg by mouth once daily   COLLAGEN MISC Use 66 mg once daily   cyanocobalamin (VITAMIN B12) 1000 MCG tablet Take by mouth as needed   diphenhydramine HCl (SLEEP ORAL) Take 10 mg by mouth once daily   EPINEPHrine (EPIPEN) 0.3 mg/0.3 mL auto-injector   escitalopram oxalate (LEXAPRO) 5 MG tablet Take 1 tablet (5 mg total) by mouth once daily for 90 days 30 tablet 2   FIBER-CAPS, PSYLLIUM HUSK, ORAL Take 1 capsule by mouth once daily   fluticasone propionate (FLONASE) 50 mcg/actuation nasal spray Place 2 sprays into both nostrils once daily   gabapentin (NEURONTIN) 100 MG capsule Take 1 capsule (100 mg total) by mouth nightly 30 capsule 11   GINKGO BILOBA-GINKGO BILOBA EX ORAL Take 1 tablet by mouth once daily   Herbal Supplement Herbalife supplements   norethindrone (JENCYCLA) 0.35 mg tablet Take 1 tablet (0.35 mg total) by mouth once daily 30 tablet 11   omega-3 fatty acids/fish oil (OMEGA 3 FISH OIL ORAL)  Take 1 capsule by mouth once daily   Allergies:    Meloxicam Other (Throat felt tight, tachycardia, rash, stomach pain, constipation)   Nsaids (Non-Steroidal Anti-Inflammatory Drug) Other (Throat felt tight, tachycardia, rash, stomach pain)   Past Medical History:   Allergy   Anxiety   Arthritis   Carpal tunnel syndrome   Chronic tension headaches   Hemorrhoids   Seasonal allergies   Past Surgical History:   OTHER SURGERY 04/03/2020 (Chemodenervation of anal sphincter I. Lysle Pearl)   TUBAL LIGATION 2014   Family History:   High blood pressure (Hypertension) Mother   Depression Mother   High blood pressure (Hypertension) Father   Asthma Father   Cancer Maternal Grandfather   Arthritis Paternal Grandmother   Arthritis Paternal Grandfather   Social History:   Socioeconomic History:   Marital status: Married  Tobacco Use   Smoking status: Never Smoker   Smokeless tobacco: Never Used  Scientific laboratory technician Use: Never used  Substance and Sexual Activity   Alcohol use: Yes  Comment: 2 a 3 por semana ; una lata de [employer] hard lemonade. vino   Drug use: Never   Sexual activity: Yes  Partners: Male  Birth control/protection: Surgical   Review of Systems:  A comprehensive 14 point ROS was performed, reviewed, and the pertinent orthopaedic findings are documented in the HPI.  Physical Exam: Vitals:  06/28/21 1159  BP: 126/72  Weight: 69.6 kg (153 lb 6.4 oz)  Height: 154.9 cm (  5\' 1" )  PainSc: 8  PainLoc: Knee   General/Constitutional: The patient appears to be well-nourished, well-developed, and in no acute distress. Neuro/Psych: Normal mood and affect, oriented to person, place and time. Eyes: Non-icteric. Pupils are equal, round, and reactive to light, and exhibit synchronous movement. Lymphatic: No palpable adenopathy. Respiratory: Lungs clear to auscultation, Normal chest excursion, No wheezes and Non-labored breathing Cardiovascular: Regular rate and rhythm. No  murmurs. and No edema, swelling or tenderness, except as noted in detailed exam. Vascular: No edema, swelling or tenderness, except as noted in detailed exam. Integumentary: No impressive skin lesions present, except as noted in detailed exam. Musculoskeletal: Unremarkable, except as noted in detailed exam.  Left knee exam: GAIT: normal and uses no assistive devices. ALIGNMENT: normal SKIN: unremarkable SWELLING: minimal EFFUSION: trace WARMTH: no warmth TENDERNESS: moderate over the medial joint line, mild along the lateral joint line ROM: 0 to 125 degrees with pain in maximal flexion McMURRAY'S: positive PATELLOFEMORAL: normal tracking with no peri-patellar tenderness and negative apprehension sign CREPITUS: Minimal patellofemoral crepitance LACHMAN'S: negative PIVOT SHIFT: negative ANTERIOR DRAWER: negative POSTERIOR DRAWER: negative VARUS/VALGUS: stable  She is neurovascularly intact to the left lower extremity and foot.  Knee Imaging, external: Left knee: A recent MRI scan of the left knee is available for review. By report, the study demonstrates evidence of a complex tear involving the posterior medial portion of the medial meniscus without involvement of the root. It also demonstrates moderate focal degenerative changes of the medial femoral condyle and medial tibial plateau. Minimal degenerative changes of the lateral patellofemoral compartments are noted, but the lateral meniscus appears to be in good condition, as to the anterior posterior cruciate ligaments. Both the films and report were reviewed by myself and discussed with the patient.  Assessment:   Post-traumatic osteoarthritis of left knee.   Complex tear of medial meniscus of left knee.  Plan: The treatment options were discussed with the patient utilizing the interpreter on wheels device. In addition, patient educational materials were provided regarding the diagnosis and treatment options. The patient is quite  frustrated by her symptoms and functional limitations, and is ready to consider more aggressive treatment options. Therefore, I have recommended a surgical procedure, specifically a left knee arthroscopy with debridement and repair versus partial medial meniscectomy. The procedure was discussed with the patient, as were the potential risks (including bleeding, infection, nerve and/or blood vessel injury, persistent or recurrent pain, failure of the repair, progression of arthritis, need for further surgery, blood clots, strokes, heart attacks and/or arhythmias, pneumonia, etc.) and benefits. The patient states her understanding and wishes to proceed. All of the patient's questions and concerns were answered. She can call any time with further concerns. She will return to work without restrictions. She will follow up post-surgery, routine.   H&P reviewed and patient re-examined. No changes.

## 2021-08-04 ENCOUNTER — Encounter: Payer: Self-pay | Admitting: Surgery

## 2021-08-06 NOTE — Anesthesia Postprocedure Evaluation (Signed)
Anesthesia Post Note  Patient: Lynn Carroll  Procedure(s) Performed: LEFT KNEE ARTHROSCOPY WITH DEBRIDEMENT AND REPAIR VERSUS PARTIAL MEDIAL MENISCECTOMY. (Left: Knee)  Patient location during evaluation: PACU Anesthesia Type: General Level of consciousness: awake and alert Pain management: pain level controlled Vital Signs Assessment: post-procedure vital signs reviewed and stable Respiratory status: spontaneous breathing, nonlabored ventilation and respiratory function stable Cardiovascular status: blood pressure returned to baseline and stable Postop Assessment: no apparent nausea or vomiting Anesthetic complications: no   No notable events documented.   Last Vitals:  Vitals:   08/03/21 1604 08/03/21 1702  BP: 125/86 128/72  Pulse: 79 71  Resp: 18 18  Temp:    SpO2: 100% 100%    Last Pain:  Vitals:   08/04/21 1013  TempSrc:   PainSc: [place]

## 2021-08-11 ENCOUNTER — Encounter: Payer: Self-pay | Admitting: Surgery

## 2021-08-18 ENCOUNTER — Ambulatory Visit: Payer: 59

## 2021-08-20 ENCOUNTER — Telehealth: Payer: Self-pay

## 2021-08-20 NOTE — Telephone Encounter (Signed)
Using interpreter services I left a message on patient's voicemail to return my call. Dr. Laurence Ferrari will be out of the office next week and we need to reschedule her appointment.

## 2021-08-23 ENCOUNTER — Ambulatory Visit: Payer: 59 | Admitting: Dermatology

## 2021-08-25 ENCOUNTER — Ambulatory Visit: Payer: 59

## 2021-08-26 NOTE — Progress Notes (Signed)
[facility] New Patient Evaluation and Consultation  Referring Provider: [place] PCP: [facility] Date of Service: 08/27/2021  SUBJECTIVE Chief Complaint: New Patient (Initial Visit) Lynn Carroll Lynn Carroll is a 40 y.o. female here for a consult on incontinence.)  History of Present Illness: Lynn Carroll is a 40 y.o.  hispanic  female seen in consultation at the request of Dr. [facility] for evaluation of stress incontinence.    Review of records in Care Everywhere significant for: Has urinary incontinence with running and sneezing. She has tried pelvic PT.   TVUS 08/18/21 Uterus=10.23 x 4.99 x 6.22cm Uterus anteverted Endometrium=7.42mm Rt ovary appears wnl Lt simple ovarian cyst=2.1cm No free fluid seen Fibroids seen:1)posterior=2.1cm 2)Lt posterior=1.3cm  Also has heavy periods for many years. Now 4-5 days and painful, used to be 3 days.  - hx of OCPs and IUD - no periods with IUD    Urinary Symptoms: Leaks urine with cough/ sneeze, laughing, exercise, lifting, during sex, and with a full bladder Leaks 2-3 time(s) per day. Has been going on about 8 years, after her last pregnancy.  Pad use: 2-3 liners/ mini-pads per day.   She is bothered by her UI symptoms. Has done 3 appointments for physical therapy, has helped some.   Day time voids 5.  Nocturia: 1 times per night to void. Voiding dysfunction: she does not empty her bladder well.  does not use a catheter to empty bladder.  When urinating, she feels a weak stream, difficulty starting urine stream, dribbling after finishing, the need to urinate multiple times in a row, and to push on her belly or vagina to empty bladder   UTIs:  0  UTI's in the last year.   Denies history of blood in urine and kidney or bladder stones  Pelvic Organ Prolapse Symptoms:                  She Denies a feeling of a bulge the vaginal area.   Bowel Symptom: Bowel movements: 1-2 time(s)  per day Stool consistency: hard Straining: yes.  Splinting: yes.  Incomplete evacuation: yes.  She Denies accidental bowel leakage / fecal incontinence Bowel regimen:  flax  Sexual Function Sexually active: yes.  Sexual orientation:  heterosexual Pain with sex: Yes, deep in the pelvis,    Pelvic Pain Admits to pelvic pain Location: in the back Pain occurs: almost always Prior pain treatment: pelvic therapy Improved by: therapy exercises and ice packs Worsened by: at work when she bends or walks too much  She is interested in a hysterectomy for her AUB. Per [facility] notes, would defer to Urogyn if planning surgical intervention.   Past Medical History:  Past Medical History:  Diagnosis Date   Acne    Anxiety    Dermatitis    Nasal congestion      Past Surgical History:   Past Surgical History:  Procedure Laterality Date   ANAL FISSURE REPAIR N/A 04/03/2020   Procedure: ANAL FISSURE REPAIR w/ botox;  Surgeon: Benjamine Sprague, DO;  Location: [facility] ORS;  Service: General;  Laterality: N/A;   KNEE ARTHROSCOPY Left 08/03/2021   Procedure: LEFT KNEE ARTHROSCOPY WITH DEBRIDEMENT AND LATERAL MINISECTOMY WITH INJECTION OF HARVESTED FAT CELLS;  Surgeon: Corky Mull, MD;  Location: [facility] ORS;  Service: Orthopedics;  Laterality: Left;   TUBAL LIGATION       Past OB/GYN History: OB History  Gravida Para Term Preterm AB Living  4  1 3  SAB IAB Ectopic Multiple Live Births  1       3    # Outcome Date GA Lbr Len/2nd Weight Sex Delivery Anes PTL Lv  4 Gravida           3 Gravida           2 Gravida           1 SAB            Patient's last menstrual period was 07/30/2021. Contraception: norethindrone pills. Last pap smear was 2018.  Any history of abnormal pap smears: no.   Medications: She has a current medication list which includes the following prescription(s): cetirizine, escitalopram, gabapentin, isotretinoin, and norethindrone.   Allergies: Patient is  allergic to nsaids and meloxicam.   Social History:  Social History   Tobacco Use   Smoking status: Never   Smokeless tobacco: Never  Vaping Use   Vaping Use: Never used  Substance Use Topics   Alcohol use: Yes    Comment: occasionally    Drug use: Never    Relationship status: long-term partner She lives with partner and 3 children.   She is not employed. Regular exercise: No History of abuse: No  Family History:   Family History  Problem Relation Age of Onset   Hyperlipidemia Mother    Hypertension Mother    Asthma Father      Review of Systems: Review of Systems  Constitutional:  Negative for fever, malaise/fatigue and weight loss.  Respiratory:  Negative for cough, shortness of breath and wheezing.   Cardiovascular:  Negative for chest pain, palpitations and leg swelling.  Gastrointestinal:  Positive for abdominal pain. Negative for blood in stool.  Genitourinary:  Negative for dysuria.       + abnormal periods  Musculoskeletal:  Positive for myalgias.  Skin:  Negative for rash.  Neurological:  Positive for headaches. Negative for dizziness.  Endo/Heme/Allergies:  Does not bruise/bleed easily.  Psychiatric/Behavioral:  Positive for depression. The patient is nervous/anxious.     OBJECTIVE Physical Exam: Vitals:   08/27/21 0944  BP: 126/76  Pulse: 91  Weight: 151 lb (68.5 kg)  Height: 5\' 1"  (1.549 m)    Physical Exam Constitutional:      General: She is not in acute distress. Pulmonary:     Effort: Pulmonary effort is normal.  Abdominal:     General: There is no distension.     Palpations: Abdomen is soft.     Tenderness: There is no abdominal tenderness. There is no rebound.  Musculoskeletal:        General: No swelling. Normal range of motion.  Skin:    General: Skin is warm and dry.     Findings: No rash.  Neurological:     Mental Status: She is alert and oriented to person, place, and time.  Psychiatric:        Mood and Affect: Mood  normal.        Behavior: Behavior normal.     GU / Detailed Urogynecologic Evaluation:  Pelvic Exam: Normal external female genitalia; Bartholin's and Skene's glands normal in appearance; urethral meatus normal in appearance, no urethral masses or discharge.   CST: negative  Speculum exam reveals normal vaginal mucosa without atrophy. Cervix normal appearance. Uterus normal single, nontender. Adnexa no mass, fullness, tenderness.     Pelvic floor strength I/V  Pelvic floor musculature: Right levator non-tender, Right obturator non-tender, Left levator tender, Left obturator non-tender  POP-Q:   POP-Q  -3                                            Aa   -3                                           Ba  -9                                              C   3                                            Gh  2.5                                            Pb  11                                            tvl   -2                                            Ap  -2                                            Bp  -11                                              D     Rectal Exam:  Normal external rectum  Post-Void Residual (PVR) by Bladder Scan: In order to evaluate bladder emptying, we discussed obtaining a postvoid residual and she agreed to this procedure.  Procedure: The ultrasound unit was placed on the patient's abdomen in the suprapubic region after the patient had voided. A PVR of 0 ml was obtained by bladder scan.  Laboratory Results: POC urine: negative   ASSESSMENT AND PLAN Ms. Letisia Schwalb is a 40 y.o. with:  1. SUI (stress urinary incontinence, female)   2. Urinary frequency   3. Abnormal uterine bleeding    SUI - For treatment of stress urinary incontinence,  non-surgical options include expectant management, weight loss, physical therapy, as well as a pessary.  Surgical options include a midurethral sling,  and transurethral injection of a bulking  agent. - She is going to consider her options, provided handouts in [place]. She is leaning toward a surgical option. Will have her return for simple CMG testing to demonstrate leakage.  2. AUB - Evaluated by GYN at [facility] for AUB. She is interested in hysterectomy. If she decides on surgical option for SUI, can discuss concurrent hysterectomy (TLH).   Return for CMG testing   Jaquita Folds, MD

## 2021-08-27 ENCOUNTER — Encounter: Payer: Self-pay | Admitting: Obstetrics and Gynecology

## 2021-08-27 ENCOUNTER — Ambulatory Visit (INDEPENDENT_AMBULATORY_CARE_PROVIDER_SITE_OTHER): Payer: 59 | Admitting: Obstetrics and Gynecology

## 2021-08-27 ENCOUNTER — Other Ambulatory Visit: Payer: Self-pay

## 2021-08-27 VITALS — BP 126/76 | HR 91 | Ht 61.0 in | Wt 151.0 lb

## 2021-08-27 DIAGNOSIS — N393 Stress incontinence (female) (male): Secondary | ICD-10-CM

## 2021-08-27 DIAGNOSIS — N939 Abnormal uterine and vaginal bleeding, unspecified: Secondary | ICD-10-CM

## 2021-08-27 DIAGNOSIS — R350 Frequency of micturition: Secondary | ICD-10-CM | POA: Diagnosis not present

## 2021-08-27 LAB — POCT URINALYSIS DIPSTICK
Appearance: NORMAL
Bilirubin, UA: NEGATIVE
Blood, UA: NEGATIVE
Glucose, UA: NEGATIVE
Ketones, UA: NEGATIVE
Leukocytes, UA: NEGATIVE
Nitrite, UA: NEGATIVE
Protein, UA: NEGATIVE
Spec Grav, UA: 1.01 (ref 1.010–1.025)
Urobilinogen, UA: 0.2 E.U./dL
pH, UA: 6 (ref 5.0–8.0)

## 2021-09-08 ENCOUNTER — Ambulatory Visit: Payer: 59

## 2021-09-15 ENCOUNTER — Ambulatory Visit: Payer: 59

## 2021-09-21 ENCOUNTER — Ambulatory Visit: Payer: 59 | Attending: Obstetrics and Gynecology

## 2021-09-21 DIAGNOSIS — E669 Obesity, unspecified: Secondary | ICD-10-CM | POA: Diagnosis not present

## 2021-09-21 DIAGNOSIS — F331 Major depressive disorder, recurrent, moderate: Secondary | ICD-10-CM | POA: Diagnosis not present

## 2021-09-21 DIAGNOSIS — F411 Generalized anxiety disorder: Secondary | ICD-10-CM | POA: Diagnosis not present

## 2021-09-21 DIAGNOSIS — Z6832 Body mass index (BMI) 32.0-32.9, adult: Secondary | ICD-10-CM | POA: Diagnosis not present

## 2021-09-28 ENCOUNTER — Ambulatory Visit: Payer: 59

## 2021-09-30 NOTE — Progress Notes (Signed)
[name] Return Visit  SUBJECTIVE  History of Present Illness: Lynn Carroll is a 41 y.o. female seen in follow-up for stress urinary incontinence and AUB.  TVUS 08/18/21: Uterus=10.23 x 4.99 x 6.22cm Uterus anteverted Endometrium=7.27mm Rt ovary appears wnl Lt simple ovarian cyst=2.1cm No free fluid seen Fibroids seen:1)posterior=2.1cm 2)Lt posterior=1.3cm  Past Medical History: Patient  has a past medical history of Acne, Anxiety, Dermatitis, and Nasal congestion.   Past Surgical History: She  has a past surgical history that includes Tubal ligation; Anal fissure repair (N/A, 04/03/2020); and Knee arthroscopy (Left, 08/03/2021).   Medications: She has a current medication list which includes the following prescription(s): cetirizine, escitalopram, gabapentin, norethindrone, and phentermine.   Allergies: Patient is allergic to nsaids and meloxicam.   Social History: Patient  reports that she has never smoked. She has never used smokeless tobacco. She reports current alcohol use. She reports that she does not use drugs.      OBJECTIVE     Physical Exam: Vitals:   10/01/21 1304  BP: 125/85  Pulse: 94   Gen: No apparent distress, A&O x 3.  Detailed Urogynecologic Evaluation:  Deferred. Prior exam showed:  POP-Q (08/27/21):    POP-Q   -3                                            Aa   -3                                           Ba   -9                                              C    3                                            Gh   2.5                                            Pb   11                                            tvl    -2                                            Ap   -2                                            Bp   -11  D      Verbal consent was obtained to perform simple CMG procedure:   Prolapse was reduced using 2 large cotton swabs. Urethra was  prepped with betadine and a 79F catheter was placed and bladder was drained completely. The bladder was then backfilled with sterile water by gravity.  First sensation: 175 First Desire: 225 Strong Desire: 300 Capacity: 525, small leak with urgency at capacity.  Cough stress test was positive in the standing position. Valsalva stress test was negative.  She was was allowed to void on her own.   Interpretation: CMG showed normal sensation, and normal cystometric capacity. Findings positive for stress incontinence, positive for detrusor overactivity.     ASSESSMENT AND PLAN    [name] is a 41 y.o. with:  1. SUI (stress urinary incontinence, female)   2. Abnormal uterine bleeding (AUB)    - She has previously tried medication management and IUD and desires definitive treatment for her menorrhagia. Per GYN notes, will defer surgery to Urogyn if planning procedure for incontinence.   Plan for surgery: Exam under anesthesia, TLH, BS, midurethral sling, cystoscopy  - We reviewed the patient's specific anatomic and functional findings, with the assistance of diagrams, and together finalized the above procedure. The planned surgical procedures were discussed along with the surgical risks outlined below, which were also provided on a detailed handout. Additional treatment options including expectant management, conservative management, medical management were discussed where appropriate.  We reviewed the benefits and risks of each treatment option.  - For preop Visit:  She is required to have a visit within 30 days of her surgery.   - Medical clearance: not required - Anticoagulant use: No - Medicaid Hysterectomy form: not required - Accepts blood transfusion: will confirm at pre op - Expected length of stay: outpatient  Request sent for surgery scheduling.   Jaquita Folds, MD  Time spent: I spent 25 minutes dedicated to the care of this patient on the date of this  encounter to include pre-visit review of records, face-to-face time with the patient  and post visit documentation. Additional time was spent on the procedure.

## 2021-10-01 ENCOUNTER — Encounter: Payer: Self-pay | Admitting: Obstetrics and Gynecology

## 2021-10-01 ENCOUNTER — Ambulatory Visit (INDEPENDENT_AMBULATORY_CARE_PROVIDER_SITE_OTHER): Payer: 59 | Admitting: Obstetrics and Gynecology

## 2021-10-01 ENCOUNTER — Other Ambulatory Visit: Payer: Self-pay

## 2021-10-01 VITALS — BP 125/85 | HR 94

## 2021-10-01 DIAGNOSIS — N920 Excessive and frequent menstruation with regular cycle: Secondary | ICD-10-CM

## 2021-10-01 DIAGNOSIS — N393 Stress incontinence (female) (male): Secondary | ICD-10-CM

## 2021-10-21 DIAGNOSIS — R200 Anesthesia of skin: Secondary | ICD-10-CM | POA: Diagnosis not present

## 2021-10-21 DIAGNOSIS — M5442 Lumbago with sciatica, left side: Secondary | ICD-10-CM | POA: Diagnosis not present

## 2021-10-21 DIAGNOSIS — Z6831 Body mass index (BMI) 31.0-31.9, adult: Secondary | ICD-10-CM | POA: Diagnosis not present

## 2021-10-21 DIAGNOSIS — G8929 Other chronic pain: Secondary | ICD-10-CM | POA: Diagnosis not present

## 2021-10-27 ENCOUNTER — Telehealth (HOSPITAL_BASED_OUTPATIENT_CLINIC_OR_DEPARTMENT_OTHER): Payer: Self-pay | Admitting: *Deleted

## 2021-10-27 NOTE — Telephone Encounter (Signed)
Attempted to contact pt to schedule surgery. Spanish intepreter # [phone] Lynn Carroll) placed call. Pt initially answered but then hung up. Attempted to call pt back but got voicemail. Message left for pt to call the office back to schedule surgery.

## 2021-11-03 DIAGNOSIS — J301 Allergic rhinitis due to pollen: Secondary | ICD-10-CM | POA: Diagnosis not present

## 2021-11-08 DIAGNOSIS — J301 Allergic rhinitis due to pollen: Secondary | ICD-10-CM | POA: Diagnosis not present

## 2021-11-18 ENCOUNTER — Telehealth (HOSPITAL_BASED_OUTPATIENT_CLINIC_OR_DEPARTMENT_OTHER): Payer: Self-pay | Admitting: *Deleted

## 2021-11-18 DIAGNOSIS — Z6831 Body mass index (BMI) 31.0-31.9, adult: Secondary | ICD-10-CM | POA: Diagnosis not present

## 2021-11-18 DIAGNOSIS — E663 Overweight: Secondary | ICD-10-CM | POA: Diagnosis not present

## 2021-11-18 NOTE — Telephone Encounter (Signed)
Attempted to contact pt with Spanish Interpreter Francina # [phone] to schedule surgery. Voicemail left.  ?

## 2021-12-16 DIAGNOSIS — M5441 Lumbago with sciatica, right side: Secondary | ICD-10-CM | POA: Diagnosis not present

## 2021-12-16 DIAGNOSIS — M5442 Lumbago with sciatica, left side: Secondary | ICD-10-CM | POA: Diagnosis not present

## 2021-12-16 DIAGNOSIS — G8929 Other chronic pain: Secondary | ICD-10-CM | POA: Diagnosis not present

## 2021-12-21 DIAGNOSIS — E663 Overweight: Secondary | ICD-10-CM | POA: Diagnosis not present

## 2021-12-21 DIAGNOSIS — Z6830 Body mass index (BMI) 30.0-30.9, adult: Secondary | ICD-10-CM | POA: Diagnosis not present

## 2021-12-27 DIAGNOSIS — M5442 Lumbago with sciatica, left side: Secondary | ICD-10-CM | POA: Diagnosis not present

## 2021-12-27 DIAGNOSIS — M5441 Lumbago with sciatica, right side: Secondary | ICD-10-CM | POA: Diagnosis not present

## 2021-12-27 DIAGNOSIS — G8929 Other chronic pain: Secondary | ICD-10-CM | POA: Diagnosis not present

## 2022-01-10 DIAGNOSIS — M5441 Lumbago with sciatica, right side: Secondary | ICD-10-CM | POA: Diagnosis not present

## 2022-01-10 DIAGNOSIS — G8929 Other chronic pain: Secondary | ICD-10-CM | POA: Diagnosis not present

## 2022-01-10 DIAGNOSIS — M5442 Lumbago with sciatica, left side: Secondary | ICD-10-CM | POA: Diagnosis not present

## 2022-01-18 DIAGNOSIS — K625 Hemorrhage of anus and rectum: Secondary | ICD-10-CM | POA: Diagnosis not present

## 2022-01-18 DIAGNOSIS — K5900 Constipation, unspecified: Secondary | ICD-10-CM | POA: Diagnosis not present

## 2022-01-18 DIAGNOSIS — Z6829 Body mass index (BMI) 29.0-29.9, adult: Secondary | ICD-10-CM | POA: Diagnosis not present

## 2022-01-19 DIAGNOSIS — M79673 Pain in unspecified foot: Secondary | ICD-10-CM | POA: Diagnosis not present

## 2022-01-19 DIAGNOSIS — M722 Plantar fascial fibromatosis: Secondary | ICD-10-CM | POA: Diagnosis not present

## 2022-01-25 DIAGNOSIS — M79673 Pain in unspecified foot: Secondary | ICD-10-CM | POA: Diagnosis not present

## 2022-01-25 DIAGNOSIS — M722 Plantar fascial fibromatosis: Secondary | ICD-10-CM | POA: Diagnosis not present

## 2022-02-14 DIAGNOSIS — J301 Allergic rhinitis due to pollen: Secondary | ICD-10-CM | POA: Diagnosis not present

## 2022-02-22 DIAGNOSIS — E663 Overweight: Secondary | ICD-10-CM | POA: Diagnosis not present

## 2022-02-23 DIAGNOSIS — N3000 Acute cystitis without hematuria: Secondary | ICD-10-CM | POA: Diagnosis not present

## 2022-02-23 DIAGNOSIS — R3989 Other symptoms and signs involving the genitourinary system: Secondary | ICD-10-CM | POA: Diagnosis not present

## 2022-02-23 DIAGNOSIS — Z01419 Encounter for gynecological examination (general) (routine) without abnormal findings: Secondary | ICD-10-CM | POA: Diagnosis not present

## 2022-03-09 DIAGNOSIS — N3946 Mixed incontinence: Secondary | ICD-10-CM | POA: Diagnosis not present

## 2022-03-09 DIAGNOSIS — N939 Abnormal uterine and vaginal bleeding, unspecified: Secondary | ICD-10-CM | POA: Diagnosis not present

## 2022-03-16 DIAGNOSIS — J301 Allergic rhinitis due to pollen: Secondary | ICD-10-CM | POA: Diagnosis not present

## 2022-03-16 DIAGNOSIS — K219 Gastro-esophageal reflux disease without esophagitis: Secondary | ICD-10-CM | POA: Diagnosis not present

## 2022-03-21 DIAGNOSIS — J301 Allergic rhinitis due to pollen: Secondary | ICD-10-CM | POA: Diagnosis not present

## 2022-03-28 DIAGNOSIS — J301 Allergic rhinitis due to pollen: Secondary | ICD-10-CM | POA: Diagnosis not present

## 2022-03-29 ENCOUNTER — Other Ambulatory Visit: Payer: Self-pay | Admitting: Family Medicine

## 2022-03-29 DIAGNOSIS — R300 Dysuria: Secondary | ICD-10-CM | POA: Diagnosis not present

## 2022-03-29 DIAGNOSIS — Z1231 Encounter for screening mammogram for malignant neoplasm of breast: Secondary | ICD-10-CM | POA: Diagnosis not present

## 2022-03-29 DIAGNOSIS — Z8719 Personal history of other diseases of the digestive system: Secondary | ICD-10-CM | POA: Diagnosis not present

## 2022-03-29 DIAGNOSIS — K5900 Constipation, unspecified: Secondary | ICD-10-CM | POA: Diagnosis not present

## 2022-03-29 DIAGNOSIS — Z6829 Body mass index (BMI) 29.0-29.9, adult: Secondary | ICD-10-CM | POA: Diagnosis not present

## 2022-04-11 DIAGNOSIS — J301 Allergic rhinitis due to pollen: Secondary | ICD-10-CM | POA: Diagnosis not present

## 2022-04-12 ENCOUNTER — Other Ambulatory Visit: Payer: 59

## 2022-04-18 DIAGNOSIS — J301 Allergic rhinitis due to pollen: Secondary | ICD-10-CM | POA: Diagnosis not present

## 2022-04-22 ENCOUNTER — Ambulatory Visit: Admit: 2022-04-22 | Payer: 59 | Admitting: Obstetrics and Gynecology

## 2022-04-22 DIAGNOSIS — J301 Allergic rhinitis due to pollen: Secondary | ICD-10-CM | POA: Diagnosis not present

## 2022-04-22 SURGERY — HYSTERECTOMY, TOTAL, LAPAROSCOPIC, WITH SALPINGECTOMY
Anesthesia: Choice

## 2022-04-25 DIAGNOSIS — J301 Allergic rhinitis due to pollen: Secondary | ICD-10-CM | POA: Diagnosis not present

## 2022-05-02 DIAGNOSIS — N926 Irregular menstruation, unspecified: Secondary | ICD-10-CM | POA: Diagnosis not present

## 2022-05-02 DIAGNOSIS — Z6830 Body mass index (BMI) 30.0-30.9, adult: Secondary | ICD-10-CM | POA: Diagnosis not present

## 2022-05-02 DIAGNOSIS — J301 Allergic rhinitis due to pollen: Secondary | ICD-10-CM | POA: Diagnosis not present

## 2022-05-02 DIAGNOSIS — R300 Dysuria: Secondary | ICD-10-CM | POA: Diagnosis not present

## 2022-05-16 DIAGNOSIS — J301 Allergic rhinitis due to pollen: Secondary | ICD-10-CM | POA: Diagnosis not present

## 2022-05-23 DIAGNOSIS — J301 Allergic rhinitis due to pollen: Secondary | ICD-10-CM | POA: Diagnosis not present

## 2022-05-30 DIAGNOSIS — J301 Allergic rhinitis due to pollen: Secondary | ICD-10-CM | POA: Diagnosis not present

## 2022-06-06 DIAGNOSIS — J301 Allergic rhinitis due to pollen: Secondary | ICD-10-CM | POA: Diagnosis not present

## 2022-06-13 DIAGNOSIS — J301 Allergic rhinitis due to pollen: Secondary | ICD-10-CM | POA: Diagnosis not present

## 2022-06-20 DIAGNOSIS — J301 Allergic rhinitis due to pollen: Secondary | ICD-10-CM | POA: Diagnosis not present

## 2022-06-27 ENCOUNTER — Encounter: Payer: Self-pay | Admitting: *Deleted

## 2022-07-04 DIAGNOSIS — Z6831 Body mass index (BMI) 31.0-31.9, adult: Secondary | ICD-10-CM | POA: Diagnosis not present

## 2022-07-04 DIAGNOSIS — M5441 Lumbago with sciatica, right side: Secondary | ICD-10-CM | POA: Diagnosis not present

## 2022-07-04 DIAGNOSIS — Z1231 Encounter for screening mammogram for malignant neoplasm of breast: Secondary | ICD-10-CM | POA: Diagnosis not present

## 2022-07-04 DIAGNOSIS — M5442 Lumbago with sciatica, left side: Secondary | ICD-10-CM | POA: Diagnosis not present

## 2022-07-04 DIAGNOSIS — K6289 Other specified diseases of anus and rectum: Secondary | ICD-10-CM | POA: Diagnosis not present

## 2022-07-04 DIAGNOSIS — G8929 Other chronic pain: Secondary | ICD-10-CM | POA: Diagnosis not present

## 2022-07-04 DIAGNOSIS — Z23 Encounter for immunization: Secondary | ICD-10-CM | POA: Diagnosis not present

## 2022-07-11 DIAGNOSIS — J301 Allergic rhinitis due to pollen: Secondary | ICD-10-CM | POA: Diagnosis not present

## 2022-07-18 DIAGNOSIS — J301 Allergic rhinitis due to pollen: Secondary | ICD-10-CM | POA: Diagnosis not present

## 2022-07-22 DIAGNOSIS — J301 Allergic rhinitis due to pollen: Secondary | ICD-10-CM | POA: Diagnosis not present

## 2022-07-25 ENCOUNTER — Other Ambulatory Visit: Payer: Self-pay | Admitting: Gastroenterology

## 2022-07-25 DIAGNOSIS — J301 Allergic rhinitis due to pollen: Secondary | ICD-10-CM | POA: Diagnosis not present

## 2022-07-25 DIAGNOSIS — R1013 Epigastric pain: Secondary | ICD-10-CM | POA: Diagnosis not present

## 2022-07-25 DIAGNOSIS — G8929 Other chronic pain: Secondary | ICD-10-CM

## 2022-07-25 DIAGNOSIS — K5909 Other constipation: Secondary | ICD-10-CM

## 2022-08-08 ENCOUNTER — Ambulatory Visit
Admission: RE | Admit: 2022-08-08 | Discharge: 2022-08-08 | Disposition: A | Discharge status: Home / Self Care | Payer: 59 | Source: Ambulatory Visit | Attending: Gastroenterology | Admitting: Gastroenterology

## 2022-08-08 DIAGNOSIS — G8929 Other chronic pain: Secondary | ICD-10-CM | POA: Diagnosis not present

## 2022-08-08 DIAGNOSIS — R1013 Epigastric pain: Secondary | ICD-10-CM | POA: Insufficient documentation

## 2022-08-08 DIAGNOSIS — M5441 Lumbago with sciatica, right side: Secondary | ICD-10-CM | POA: Diagnosis not present

## 2022-08-08 DIAGNOSIS — K5909 Other constipation: Secondary | ICD-10-CM | POA: Diagnosis not present

## 2022-08-08 DIAGNOSIS — M5442 Lumbago with sciatica, left side: Secondary | ICD-10-CM | POA: Diagnosis not present

## 2022-08-08 DIAGNOSIS — M48062 Spinal stenosis, lumbar region with neurogenic claudication: Secondary | ICD-10-CM | POA: Diagnosis not present

## 2022-08-08 DIAGNOSIS — J301 Allergic rhinitis due to pollen: Secondary | ICD-10-CM | POA: Diagnosis not present

## 2022-08-17 DIAGNOSIS — J301 Allergic rhinitis due to pollen: Secondary | ICD-10-CM | POA: Diagnosis not present

## 2022-08-23 DIAGNOSIS — M5442 Lumbago with sciatica, left side: Secondary | ICD-10-CM | POA: Diagnosis not present

## 2022-08-23 DIAGNOSIS — G8929 Other chronic pain: Secondary | ICD-10-CM | POA: Diagnosis not present

## 2022-08-23 DIAGNOSIS — M5441 Lumbago with sciatica, right side: Secondary | ICD-10-CM | POA: Diagnosis not present

## 2022-08-23 DIAGNOSIS — M48062 Spinal stenosis, lumbar region with neurogenic claudication: Secondary | ICD-10-CM | POA: Diagnosis not present

## 2022-09-07 DIAGNOSIS — J301 Allergic rhinitis due to pollen: Secondary | ICD-10-CM | POA: Diagnosis not present

## 2022-09-14 DIAGNOSIS — J301 Allergic rhinitis due to pollen: Secondary | ICD-10-CM | POA: Diagnosis not present

## 2022-09-21 DIAGNOSIS — J301 Allergic rhinitis due to pollen: Secondary | ICD-10-CM | POA: Diagnosis not present

## 2022-09-28 DIAGNOSIS — J301 Allergic rhinitis due to pollen: Secondary | ICD-10-CM | POA: Diagnosis not present

## 2022-10-10 DIAGNOSIS — J301 Allergic rhinitis due to pollen: Secondary | ICD-10-CM | POA: Diagnosis not present

## 2022-10-12 DIAGNOSIS — J301 Allergic rhinitis due to pollen: Secondary | ICD-10-CM | POA: Diagnosis not present

## 2022-10-20 ENCOUNTER — Encounter: Payer: Self-pay | Admitting: Gastroenterology

## 2022-10-20 NOTE — H&P (Signed)
Pre-Procedure H&P   Patient ID: Lynn Carroll is a 42 y.o. female.  Gastroenterology Provider: Annamaria Helling, DO  Referring Provider: Laurine Blazer, PA PCP: [place]  Date: 10/21/2022  HPI Lynn Carroll is a 42 y.o. female who presents today for Colonoscopy for Constipation, anal fissure.  Information and consent are garnered via video Spanish medical interpreter  Patient with longstanding history of constipation.  She has also developed anal fissures secondary to this.  She was started on Amitiza twice a day with no changes.  She has improved on MiraLAX.  She still notes straining with MiraLAX and occasional bright red blood per rectum.  Minimal abdominal discomfort.  This is mostly located in the upper abdomen and 12 August 2022 right upper quadrant ultrasound was negative.  Anal fissure in 2021 was treated with Botox per report  No family colon cancer or colon polyps   Past Medical History:  Diagnosis Date   Acne    Anxiety    Dermatitis    Nasal congestion     Past Surgical History:  Procedure Laterality Date   ANAL FISSURE REPAIR N/A 04/03/2020   Procedure: ANAL FISSURE REPAIR w/ botox;  Surgeon: Benjamine Sprague, DO;  Location: [facility] ORS;  Service: General;  Laterality: N/A;   KNEE ARTHROSCOPY Left 08/03/2021   Procedure: LEFT KNEE ARTHROSCOPY WITH DEBRIDEMENT AND LATERAL MINISECTOMY WITH INJECTION OF HARVESTED FAT CELLS;  Surgeon: Corky Mull, MD;  Location: [facility] ORS;  Service: Orthopedics;  Laterality: Left;   TUBAL LIGATION      Family History No h/o GI disease or malignancy  Review of Systems  Constitutional:  Negative for activity change, appetite change, chills, diaphoresis, fatigue, fever and unexpected weight change.  HENT:  Negative for trouble swallowing and voice change.   Respiratory:  Negative for shortness of breath and wheezing.   Cardiovascular:  Negative for chest pain, palpitations and leg swelling.   Gastrointestinal:  Positive for blood in stool and constipation. Negative for abdominal distention, abdominal pain, anal bleeding, diarrhea, nausea, rectal pain and vomiting.  Musculoskeletal:  Negative for arthralgias and myalgias.  Skin:  Negative for color change and pallor.  Neurological:  Negative for dizziness, syncope and weakness.  Psychiatric/Behavioral:  Negative for confusion.   All other systems reviewed and are negative.    Medications No current facility-administered medications on file prior to encounter.   Current Outpatient Medications on File Prior to Encounter  Medication Sig Dispense Refill   escitalopram (LEXAPRO) 5 MG tablet Take 5 mg by mouth daily.     gabapentin (NEURONTIN) 100 MG capsule Take 100 mg by mouth at bedtime.     cetirizine (ZYRTEC) 10 MG tablet Take 10 mg by mouth daily.     norethindrone (MICRONOR) 0.35 MG tablet Take 1 tablet by mouth daily.     phentermine 37.5 MG capsule Take 37.5 mg by mouth every morning. (Patient not taking: Reported on 10/21/2022)      Pertinent medications related to GI and procedure were reviewed by me with the patient prior to the procedure   Current Facility-Administered Medications:    0.9 %  sodium chloride infusion, , Intravenous, Continuous, Annamaria Helling, DO      Allergies  Allergen Reactions   Nsaids Other (See Comments)    Chest feels tight    Meloxicam Palpitations and Rash    Throat felt tight, stomach pain, constipation   Allergies were reviewed by me prior to the procedure  Objective   Body mass index is 31.37 kg/m. Vitals:   10/21/22 1007  BP: (!) 121/108  Pulse: 76  Resp: 18  Temp: (!) 97.3 F (36.3 C)  TempSrc: Temporal  SpO2: 100%  Weight: 75.3 kg  Height: 5' 1"$  (1.549 m)     Physical Exam Vitals and nursing note reviewed.  Constitutional:      General: She is not in acute distress.    Appearance: Normal appearance. She is not ill-appearing, toxic-appearing or  diaphoretic.  HENT:     Head: Normocephalic and atraumatic.     Nose: Nose normal.     Mouth/Throat:     Mouth: Mucous membranes are moist.     Pharynx: Oropharynx is clear.  Eyes:     General: No scleral icterus.    Extraocular Movements: Extraocular movements intact.  Cardiovascular:     Rate and Rhythm: Normal rate and regular rhythm.     Heart sounds: Normal heart sounds. No murmur heard.    No friction rub. No gallop.  Pulmonary:     Effort: Pulmonary effort is normal. No respiratory distress.     Breath sounds: Normal breath sounds. No wheezing, rhonchi or rales.  Abdominal:     General: Bowel sounds are normal. There is no distension.     Palpations: Abdomen is soft.     Tenderness: There is no abdominal tenderness. There is no guarding or rebound.  Musculoskeletal:     Cervical back: Neck supple.     Right lower leg: No edema.     Left lower leg: No edema.  Skin:    General: Skin is warm and dry.     Coloration: Skin is not jaundiced or pale.  Neurological:     General: No focal deficit present.     Mental Status: She is alert and oriented to person, place, and time. Mental status is at baseline.  Psychiatric:        Mood and Affect: Mood normal.        Behavior: Behavior normal.        Thought Content: Thought content normal.        Judgment: Judgment normal.      Assessment:  Lynn Carroll is a 42 y.o. female  who presents today for Colonoscopy for Constipation, anal fissure .  Plan:  Colonoscopy with possible intervention today  Colonoscopy with possible biopsy, control of bleeding, polypectomy, and interventions as necessary has been discussed with the patient/patient representative. Informed consent was obtained from the patient/patient representative after explaining the indication, nature, and risks of the procedure including but not limited to death, bleeding, perforation, missed neoplasm/lesions, cardiorespiratory compromise, and reaction  to medications. Opportunity for questions was given and appropriate answers were provided. Patient/patient representative has verbalized understanding is amenable to undergoing the procedure.   Annamaria Helling, DO  [facility] Gastroenter[facility]  Portions of the record may have been created with voice recognition software. Occasional wrong-word or 'sound-a-like' substitutions may have occurred due to the inherent limitations of voice recognition software.  Read the chart carefully and recognize, using context, where substitutions may have occurred.

## 2022-10-21 ENCOUNTER — Ambulatory Visit: Payer: 59 | Admitting: Certified Registered"

## 2022-10-21 ENCOUNTER — Encounter
Admission: RE | Disposition: A | Discharge status: Home / Self Care | Payer: Self-pay | Source: Home / Self Care | Attending: Gastroenterology

## 2022-10-21 ENCOUNTER — Ambulatory Visit
Admission: RE | Admit: 2022-10-21 | Discharge: 2022-10-21 | Disposition: A | Discharge status: Home / Self Care | Payer: 59 | Attending: Gastroenterology | Admitting: Gastroenterology

## 2022-10-21 ENCOUNTER — Encounter: Payer: Self-pay | Admitting: Gastroenterology

## 2022-10-21 DIAGNOSIS — E669 Obesity, unspecified: Secondary | ICD-10-CM | POA: Diagnosis not present

## 2022-10-21 DIAGNOSIS — K5904 Chronic idiopathic constipation: Secondary | ICD-10-CM | POA: Insufficient documentation

## 2022-10-21 DIAGNOSIS — K602 Anal fissure, unspecified: Secondary | ICD-10-CM | POA: Diagnosis not present

## 2022-10-21 DIAGNOSIS — Z6831 Body mass index (BMI) 31.0-31.9, adult: Secondary | ICD-10-CM | POA: Diagnosis not present

## 2022-10-21 DIAGNOSIS — Q438 Other specified congenital malformations of intestine: Secondary | ICD-10-CM | POA: Diagnosis not present

## 2022-10-21 DIAGNOSIS — K621 Rectal polyp: Secondary | ICD-10-CM | POA: Diagnosis not present

## 2022-10-21 DIAGNOSIS — D128 Benign neoplasm of rectum: Secondary | ICD-10-CM | POA: Insufficient documentation

## 2022-10-21 DIAGNOSIS — F419 Anxiety disorder, unspecified: Secondary | ICD-10-CM | POA: Insufficient documentation

## 2022-10-21 HISTORY — PX: COLONOSCOPY: SHX5424

## 2022-10-21 LAB — POCT PREGNANCY, URINE: Preg Test, Ur: NEGATIVE

## 2022-10-21 SURGERY — COLONOSCOPY
Anesthesia: General

## 2022-10-21 MED ORDER — LIDOCAINE HCL (CARDIAC) PF 100 MG/5ML IV SOSY
PREFILLED_SYRINGE | INTRAVENOUS | Status: DC | PRN
  Administered 2022-10-21: 50 mg via INTRAVENOUS

## 2022-10-21 MED ORDER — PROPOFOL 10 MG/ML IV BOLUS
INTRAVENOUS | Status: DC | PRN
  Administered 2022-10-21: 50 mg via INTRAVENOUS

## 2022-10-21 MED ORDER — PROPOFOL 500 MG/50ML IV EMUL
INTRAVENOUS | Status: DC | PRN
  Administered 2022-10-21: 150 ug/kg/min via INTRAVENOUS

## 2022-10-21 MED ORDER — SODIUM CHLORIDE 0.9 % IV SOLN: INTRAVENOUS | Status: DC

## 2022-10-21 NOTE — Op Note (Signed)
[facility] Gastroenterology Patient Name: Lynn Carroll Procedure Date: 10/21/2022 9:39 AM MRN: [id number] Account #: [id number] Date of Birth: [birth year] Admit Type: Outpatient Age: 42 Room: [facility] ENDO ROOM 1 Gender: Female Note Status: Finalized Instrument Name: Colonoscope [id number] Procedure:             Colonoscopy Indications:           Chronic idiopathic constipation Providers:             Rueben Bash, DO Referring MD:          [facility] (Referring MD) Medicines:             Monitored Anesthesia Care Complications:         No immediate complications. Estimated blood loss:                         Minimal. Procedure:             Pre-Anesthesia Assessment:                        - Prior to the procedure, a History and Physical was                         performed, and patient medications and allergies were                         reviewed. The patient is competent. The risks and                         benefits of the procedure and the sedation options and                         risks were discussed with the patient. All questions                         were answered and informed consent was obtained.                         Patient identification and proposed procedure were                         verified by the physician, the nurse, the anesthetist                         and the technician in the endoscopy suite. Mental                         Status Examination: alert and oriented. Airway                         Examination: normal oropharyngeal airway and neck                         mobility. Respiratory Examination: clear to                         auscultation. CV Examination: RRR, no murmurs, no S3  or S4. Prophylactic Antibiotics: The patient does not                         require prophylactic antibiotics. Prior                         Anticoagulants: The patient has taken no anticoagulant                          or antiplatelet agents. ASA Grade Assessment: II - A                         patient with mild systemic disease. After reviewing                         the risks and benefits, the patient was deemed in                         satisfactory condition to undergo the procedure. The                         anesthesia plan was to use monitored anesthesia care                         (MAC). Immediately prior to administration of                         medications, the patient was re-assessed for adequacy                         to receive sedatives. The heart rate, respiratory                         rate, oxygen saturations, blood pressure, adequacy of                         pulmonary ventilation, and response to care were                         monitored throughout the procedure. The physical                         status of the patient was re-assessed after the                         procedure.                        After obtaining informed consent, the colonoscope was                         passed under direct vision. Throughout the procedure,                         the patient's blood pressure, pulse, and oxygen                         saturations were monitored continuously. The  Colonoscope was introduced through the anus and                         advanced to the the terminal ileum, with                         identification of the appendiceal orifice and IC                         valve. The colonoscopy was performed without                         difficulty. The patient tolerated the procedure well.                         The quality of the bowel preparation was evaluated                         using the BBPS [facility] Bowel Preparation Scale) with                         scores of: Right Colon = 3, Transverse Colon = 3 and                         Left Colon = 3 (entire mucosa seen well with no                         residual  staining, small fragments of stool or opaque                         liquid). The total BBPS score equals 9. The terminal                         ileum, ileocecal valve, appendiceal orifice, and                         rectum were photographed. Findings:      The perianal and digital rectal examinations were normal. Pertinent       negatives include normal sphincter tone.      The terminal ileum appeared normal. Estimated blood loss: none.      The sigmoid colon and descending colon were moderately redundant.       Estimated blood loss: none.      A 1 mm polyp was found in the rectum. The polyp was sessile. The polyp       was removed with a cold biopsy forceps. Resection and retrieval were       complete. Estimated blood loss was minimal.      A 1 mm anal fissure was found in the anal canal. Estimated blood loss:       none.      Retroflexion in the right colon was performed.      The exam was otherwise without abnormality on direct and retroflexion       views. Impression:            - The examined portion of the ileum was normal.                        -  Redundant colon.                        - One 1 mm polyp in the rectum, removed with a cold                         biopsy forceps. Resected and retrieved.                        - Anal fissure.                        - The examination was otherwise normal on direct and                         retroflexion views.                        - - La porcin examinada del leon era normal.                        - Dos puntos redundantes.                        - Un plipo de 1 mm en el recto, extirpado con unas                         pinzas de biopsia fras. Resecado y recuperado.                        - Fisura anal.                        - Por lo dems, el examen fue normal en las vistas                         directa y en retroflexin. Recommendation:        - Patient has a contact number available for                          emergencies. The signs and symptoms of potential                         delayed complications were discussed with the patient.                         Return to normal activities tomorrow. Written                         discharge instructions were provided to the patient.                        - Discharge patient to home.                        - Resume previous diet.                        - Continue present medications.                        -  Consider ointment treatment for rectal fissure                        Continue linzess                        Continue to ensure soft bowel movements                        - Await pathology results.                        - Repeat colonoscopy for surveillance based on                         pathology results.                        - Return to GI office as previously scheduled.                        - The findings and recommendations were discussed with                         the patient. 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 All rights reserved. The codes documented in this report are preliminary and upon coder review may  be revised to meet current compliance requirements. Attending Participation:      I personally performed the entire procedure. Volney American, DO Annamaria Helling DO, DO 10/21/2022 11:06:55 AM This report has been signed electronically. Number of Addenda: 0 Note Initiated On: 10/21/2022 9:39 AM Scope Withdrawal Time: 0 hours 14  minutes 33 seconds  Total Procedure Duration: 0 hours 19 minutes 48 seconds  Estimated Blood Loss:  Estimated blood loss was minimal.      [facility]

## 2022-10-21 NOTE — Anesthesia Postprocedure Evaluation (Signed)
Anesthesia Post Note  Patient: Lynn Carroll  Procedure(s) Performed: COLONOSCOPY  Patient location during evaluation: PACU Anesthesia Type: General Level of consciousness: awake Pain management: satisfactory to patient Vital Signs Assessment: post-procedure vital signs reviewed and stable Respiratory status: nonlabored ventilation Cardiovascular status: stable Anesthetic complications: no   No notable events documented.   Last Vitals:  Vitals:   10/21/22 1007 10/21/22 1103  BP: (!) 121/108 (!) 93/57  Pulse: 76   Resp: 18   Temp: (!) 36.3 C   SpO2: 100%     Last Pain:  Vitals:   10/21/22 1109  TempSrc:   PainSc: 0-No pain                 VAN STAVEREN,Daune Divirgilio

## 2022-10-21 NOTE — Transfer of Care (Signed)
Immediate Anesthesia Transfer of Care Note  Patient: Lynn Carroll  Procedure(s) Performed: COLONOSCOPY  Patient Location: PACU and Endoscopy Unit  Anesthesia Type:General  Level of Consciousness: drowsy and patient cooperative  Airway & Oxygen Therapy: Patient Spontanous Breathing  Post-op Assessment: Report given to RN and Post -op Vital signs reviewed and stable  Post vital signs: Reviewed and stable  Last Vitals:  Vitals Value Taken Time  BP 93/57 10/21/22 1104  Temp    Pulse 59 10/21/22 1106  Resp 24 10/21/22 1106  SpO2 100 % 10/21/22 1106  Vitals shown include unvalidated device data.  Last Pain:  Vitals:   10/21/22 1103  TempSrc:   PainSc: 0-No pain         Complications: No notable events documented.

## 2022-10-21 NOTE — Anesthesia Procedure Notes (Signed)
Procedure Name: MAC Date/Time: 10/21/2022 10:35 AM  Performed by: Jerrye Noble, CRNAPre-anesthesia Checklist: Patient identified, Emergency Drugs available, Suction available and Patient being monitored Patient Re-evaluated:Patient Re-evaluated prior to induction Oxygen Delivery Method: Nasal cannula

## 2022-10-21 NOTE — Interval H&P Note (Signed)
History and Physical Interval Note: Preprocedure H&P from 10/21/22  was reviewed and there was no interval change after seeing and examining the patient.  Written consent was obtained from the patient after discussion of risks, benefits, and alternatives. Patient has consented to proceed with Colonoscopy with possible intervention   10/21/2022 10:34 AM  [place]  has presented today for surgery, with the diagnosis of Chronic constipation (K59.09).  The various methods of treatment have been discussed with the patient and family. After consideration of risks, benefits and other options for treatment, the patient has consented to  Procedure(s) with comments: COLONOSCOPY (N/A) - SPANISH INTERPRETER as a surgical intervention.  The patient's history has been reviewed, patient examined, no change in status, stable for surgery.  I have reviewed the patient's chart and labs.  Questions were answered to the patient's satisfaction.     Annamaria Helling

## 2022-10-21 NOTE — Interval H&P Note (Deleted)
History and Physical Interval Note: Preprocedure H&P from 10/21/22  was reviewed and there was no interval change after seeing and examining the patient.  Written consent was obtained from the patient after discussion of risks, benefits, and alternatives. Patient has consented to proceed with Esophagogastroduodenoscopy with possible intervention   10/21/2022 10:33 AM  Lynn Carroll  has presented today for surgery, with the diagnosis of Chronic constipation (K59.09).  The various methods of treatment have been discussed with the patient and family. After consideration of risks, benefits and other options for treatment, the patient has consented to  Procedure(s) with comments: COLONOSCOPY (N/A) - SPANISH INTERPRETER as a surgical intervention.  The patient's history has been reviewed, patient examined, no change in status, stable for surgery.  I have reviewed the patient's chart and labs.  Questions were answered to the patient's satisfaction.     Annamaria Helling

## 2022-10-21 NOTE — Anesthesia Preprocedure Evaluation (Signed)
Anesthesia Evaluation  Patient identified by MRN, date of birth, ID band Patient awake    Reviewed: Allergy & Precautions, NPO status , Patient's Chart, lab work & pertinent test results  Airway Mallampati: III  TM Distance: <3 FB Neck ROM: Full  Mouth opening: Limited Mouth Opening  Dental  (+) Teeth Intact Upper and lower braces:   Pulmonary neg pulmonary ROS   Pulmonary exam normal breath sounds clear to auscultation       Cardiovascular Exercise Tolerance: Good negative cardio ROS Normal cardiovascular exam Rhythm:Regular     Neuro/Psych   Anxiety      Neuromuscular disease negative neurological ROS  negative psych ROS   GI/Hepatic negative GI ROS, Neg liver ROS,,,  Endo/Other  negative endocrine ROS    Renal/GU negative Renal ROS  negative genitourinary   Musculoskeletal   Abdominal Normal abdominal exam  (+)   Peds negative pediatric ROS (+)  Hematology negative hematology ROS (+)   Anesthesia Other Findings Past Medical History: No date: Acne No date: Anxiety No date: Dermatitis No date: Nasal congestion  Past Surgical History: 04/03/2020: ANAL FISSURE REPAIR; N/A     Comment:  Procedure: ANAL FISSURE REPAIR w/ botox;  Surgeon:               Benjamine Sprague, DO;  Location: [facility] ORS;  Service: General;              Laterality: N/A; 08/03/2021: KNEE ARTHROSCOPY; Left     Comment:  Procedure: LEFT KNEE ARTHROSCOPY WITH DEBRIDEMENT AND               LATERAL MINISECTOMY WITH INJECTION OF HARVESTED FAT               CELLS;  Surgeon: Corky Mull, MD;  Location: [facility] ORS;               Service: Orthopedics;  Laterality: Left; No date: TUBAL LIGATION  BMI    Body Mass Index: 31.37 kg/m      Reproductive/Obstetrics negative OB ROS                             Anesthesia Physical Anesthesia Plan  ASA: 2  Anesthesia Plan: General   Post-op Pain Management:    Induction:  Intravenous  PONV Risk Score and Plan: Propofol infusion and TIVA  Airway Management Planned: Natural Airway and Nasal Cannula  Additional Equipment:   Intra-op Plan:   Post-operative Plan:   Informed Consent: I have reviewed the patients History and Physical, chart, labs and discussed the procedure including the risks, benefits and alternatives for the proposed anesthesia with the patient or authorized representative who has indicated his/her understanding and acceptance.     Dental Advisory Given  Plan Discussed with: CRNA and Surgeon  Anesthesia Plan Comments:        Anesthesia Quick Evaluation

## 2022-10-24 ENCOUNTER — Encounter: Payer: Self-pay | Admitting: Gastroenterology

## 2022-10-25 LAB — SURGICAL PATHOLOGY

## 2022-10-26 DIAGNOSIS — J301 Allergic rhinitis due to pollen: Secondary | ICD-10-CM | POA: Diagnosis not present

## 2022-11-02 DIAGNOSIS — J301 Allergic rhinitis due to pollen: Secondary | ICD-10-CM | POA: Diagnosis not present

## 2022-11-09 DIAGNOSIS — J301 Allergic rhinitis due to pollen: Secondary | ICD-10-CM | POA: Diagnosis not present

## 2022-11-23 DIAGNOSIS — J301 Allergic rhinitis due to pollen: Secondary | ICD-10-CM | POA: Diagnosis not present

## 2022-12-28 DIAGNOSIS — J301 Allergic rhinitis due to pollen: Secondary | ICD-10-CM | POA: Diagnosis not present

## 2023-01-04 DIAGNOSIS — J301 Allergic rhinitis due to pollen: Secondary | ICD-10-CM | POA: Diagnosis not present

## 2023-01-09 DIAGNOSIS — J301 Allergic rhinitis due to pollen: Secondary | ICD-10-CM | POA: Diagnosis not present

## 2023-01-16 DIAGNOSIS — J301 Allergic rhinitis due to pollen: Secondary | ICD-10-CM | POA: Diagnosis not present

## 2023-01-23 DIAGNOSIS — J301 Allergic rhinitis due to pollen: Secondary | ICD-10-CM | POA: Diagnosis not present

## 2023-02-06 DIAGNOSIS — J301 Allergic rhinitis due to pollen: Secondary | ICD-10-CM | POA: Diagnosis not present

## 2023-02-20 DIAGNOSIS — J301 Allergic rhinitis due to pollen: Secondary | ICD-10-CM | POA: Diagnosis not present

## 2023-02-27 ENCOUNTER — Other Ambulatory Visit: Payer: Self-pay

## 2023-02-27 ENCOUNTER — Other Ambulatory Visit: Payer: Self-pay | Admitting: Family Medicine

## 2023-02-27 DIAGNOSIS — Z1231 Encounter for screening mammogram for malignant neoplasm of breast: Secondary | ICD-10-CM

## 2023-02-27 DIAGNOSIS — Z6833 Body mass index (BMI) 33.0-33.9, adult: Secondary | ICD-10-CM | POA: Diagnosis not present

## 2023-02-27 DIAGNOSIS — M79672 Pain in left foot: Secondary | ICD-10-CM | POA: Diagnosis not present

## 2023-02-27 DIAGNOSIS — G8929 Other chronic pain: Secondary | ICD-10-CM | POA: Diagnosis not present

## 2023-02-27 DIAGNOSIS — N926 Irregular menstruation, unspecified: Secondary | ICD-10-CM | POA: Diagnosis not present

## 2023-02-27 DIAGNOSIS — Z0000 Encounter for general adult medical examination without abnormal findings: Secondary | ICD-10-CM | POA: Diagnosis not present

## 2023-02-27 DIAGNOSIS — M25572 Pain in left ankle and joints of left foot: Secondary | ICD-10-CM | POA: Diagnosis not present

## 2023-02-27 DIAGNOSIS — J302 Other seasonal allergic rhinitis: Secondary | ICD-10-CM | POA: Diagnosis not present

## 2023-02-27 DIAGNOSIS — M25571 Pain in right ankle and joints of right foot: Secondary | ICD-10-CM | POA: Diagnosis not present

## 2023-02-27 DIAGNOSIS — M79671 Pain in right foot: Secondary | ICD-10-CM | POA: Diagnosis not present

## 2023-02-27 DIAGNOSIS — M5442 Lumbago with sciatica, left side: Secondary | ICD-10-CM | POA: Diagnosis not present

## 2023-03-06 DIAGNOSIS — J301 Allergic rhinitis due to pollen: Secondary | ICD-10-CM | POA: Diagnosis not present

## 2023-03-13 DIAGNOSIS — J301 Allergic rhinitis due to pollen: Secondary | ICD-10-CM | POA: Diagnosis not present

## 2023-03-15 DIAGNOSIS — J301 Allergic rhinitis due to pollen: Secondary | ICD-10-CM | POA: Diagnosis not present

## 2023-03-15 DIAGNOSIS — K219 Gastro-esophageal reflux disease without esophagitis: Secondary | ICD-10-CM | POA: Diagnosis not present

## 2023-03-15 DIAGNOSIS — J310 Chronic rhinitis: Secondary | ICD-10-CM | POA: Diagnosis not present

## 2023-03-20 DIAGNOSIS — J301 Allergic rhinitis due to pollen: Secondary | ICD-10-CM | POA: Diagnosis not present

## 2023-03-27 DIAGNOSIS — J305 Allergic rhinitis due to food: Secondary | ICD-10-CM | POA: Diagnosis not present

## 2023-03-27 DIAGNOSIS — J301 Allergic rhinitis due to pollen: Secondary | ICD-10-CM | POA: Diagnosis not present

## 2023-03-27 DIAGNOSIS — J310 Chronic rhinitis: Secondary | ICD-10-CM | POA: Diagnosis not present

## 2023-03-29 DIAGNOSIS — J301 Allergic rhinitis due to pollen: Secondary | ICD-10-CM | POA: Diagnosis not present

## 2023-04-10 DIAGNOSIS — J301 Allergic rhinitis due to pollen: Secondary | ICD-10-CM | POA: Diagnosis not present

## 2023-04-11 DIAGNOSIS — M79671 Pain in right foot: Secondary | ICD-10-CM | POA: Diagnosis not present

## 2023-04-11 DIAGNOSIS — F330 Major depressive disorder, recurrent, mild: Secondary | ICD-10-CM | POA: Diagnosis not present

## 2023-04-11 DIAGNOSIS — F411 Generalized anxiety disorder: Secondary | ICD-10-CM | POA: Diagnosis not present

## 2023-04-11 DIAGNOSIS — M79672 Pain in left foot: Secondary | ICD-10-CM | POA: Diagnosis not present

## 2023-04-17 DIAGNOSIS — J301 Allergic rhinitis due to pollen: Secondary | ICD-10-CM | POA: Diagnosis not present

## 2023-04-24 DIAGNOSIS — Z887 Allergy status to serum and vaccine status: Secondary | ICD-10-CM | POA: Diagnosis not present

## 2023-04-24 DIAGNOSIS — K219 Gastro-esophageal reflux disease without esophagitis: Secondary | ICD-10-CM | POA: Diagnosis not present

## 2023-04-24 DIAGNOSIS — Z87892 Personal history of anaphylaxis: Secondary | ICD-10-CM | POA: Diagnosis not present

## 2023-04-24 DIAGNOSIS — E669 Obesity, unspecified: Secondary | ICD-10-CM | POA: Diagnosis not present

## 2023-04-24 DIAGNOSIS — Z886 Allergy status to analgesic agent status: Secondary | ICD-10-CM | POA: Diagnosis not present

## 2023-04-24 DIAGNOSIS — Z6831 Body mass index (BMI) 31.0-31.9, adult: Secondary | ICD-10-CM | POA: Diagnosis not present

## 2023-04-24 DIAGNOSIS — F411 Generalized anxiety disorder: Secondary | ICD-10-CM | POA: Diagnosis not present

## 2023-04-24 DIAGNOSIS — J301 Allergic rhinitis due to pollen: Secondary | ICD-10-CM | POA: Diagnosis not present

## 2023-04-24 DIAGNOSIS — M5430 Sciatica, unspecified side: Secondary | ICD-10-CM | POA: Diagnosis not present

## 2023-04-24 DIAGNOSIS — G629 Polyneuropathy, unspecified: Secondary | ICD-10-CM | POA: Diagnosis not present

## 2023-04-24 DIAGNOSIS — F320 Major depressive disorder, single episode, mild: Secondary | ICD-10-CM | POA: Diagnosis not present

## 2023-04-24 DIAGNOSIS — Z79890 Hormone replacement therapy (postmenopausal): Secondary | ICD-10-CM | POA: Diagnosis not present

## 2023-04-24 DIAGNOSIS — N926 Irregular menstruation, unspecified: Secondary | ICD-10-CM | POA: Diagnosis not present

## 2023-05-01 DIAGNOSIS — J301 Allergic rhinitis due to pollen: Secondary | ICD-10-CM | POA: Diagnosis not present

## 2023-05-15 ENCOUNTER — Ambulatory Visit: Payer: 59 | Admitting: Family Medicine

## 2023-05-15 DIAGNOSIS — J301 Allergic rhinitis due to pollen: Secondary | ICD-10-CM | POA: Diagnosis not present

## 2023-05-29 DIAGNOSIS — R300 Dysuria: Secondary | ICD-10-CM | POA: Diagnosis not present

## 2023-05-29 DIAGNOSIS — J301 Allergic rhinitis due to pollen: Secondary | ICD-10-CM | POA: Diagnosis not present

## 2023-05-30 DIAGNOSIS — M545 Low back pain, unspecified: Secondary | ICD-10-CM | POA: Diagnosis not present

## 2023-05-30 DIAGNOSIS — M5430 Sciatica, unspecified side: Secondary | ICD-10-CM | POA: Diagnosis not present

## 2023-05-31 DIAGNOSIS — Z23 Encounter for immunization: Secondary | ICD-10-CM | POA: Diagnosis not present

## 2023-06-06 ENCOUNTER — Other Ambulatory Visit: Payer: Self-pay | Admitting: Family Medicine

## 2023-06-06 DIAGNOSIS — R1032 Left lower quadrant pain: Secondary | ICD-10-CM

## 2023-06-06 DIAGNOSIS — G8929 Other chronic pain: Secondary | ICD-10-CM

## 2023-06-06 DIAGNOSIS — R300 Dysuria: Secondary | ICD-10-CM

## 2023-06-13 ENCOUNTER — Encounter: Payer: Self-pay | Admitting: Family Medicine

## 2023-06-14 ENCOUNTER — Ambulatory Visit
Admission: RE | Admit: 2023-06-14 | Discharge: 2023-06-14 | Disposition: A | Discharge status: Home / Self Care | Payer: 59 | Source: Ambulatory Visit | Attending: Family Medicine | Admitting: Family Medicine

## 2023-06-14 DIAGNOSIS — J301 Allergic rhinitis due to pollen: Secondary | ICD-10-CM | POA: Diagnosis not present

## 2023-06-14 DIAGNOSIS — R1032 Left lower quadrant pain: Secondary | ICD-10-CM

## 2023-06-14 DIAGNOSIS — R300 Dysuria: Secondary | ICD-10-CM

## 2023-06-14 DIAGNOSIS — N859 Noninflammatory disorder of uterus, unspecified: Secondary | ICD-10-CM | POA: Diagnosis not present

## 2023-06-14 DIAGNOSIS — G8929 Other chronic pain: Secondary | ICD-10-CM

## 2023-06-14 MED ORDER — IOPAMIDOL (ISOVUE-370) INJECTION 76%
100.0000 mL | Freq: Once | INTRAVENOUS | Status: AC | PRN
  Administered 2023-06-14: 100 mL via INTRAVENOUS

## 2023-06-14 NOTE — Progress Notes (Signed)
Interpreter present for exam

## 2023-07-03 ENCOUNTER — Ambulatory Visit: Payer: 59

## 2023-07-03 DIAGNOSIS — J301 Allergic rhinitis due to pollen: Secondary | ICD-10-CM | POA: Diagnosis not present

## 2023-07-04 ENCOUNTER — Ambulatory Visit
Admission: RE | Admit: 2023-07-04 | Discharge: 2023-07-04 | Disposition: A | Discharge status: Home / Self Care | Payer: 59 | Source: Ambulatory Visit | Attending: Family Medicine | Admitting: Family Medicine

## 2023-07-04 DIAGNOSIS — Z1231 Encounter for screening mammogram for malignant neoplasm of breast: Secondary | ICD-10-CM | POA: Diagnosis not present

## 2023-07-10 DIAGNOSIS — J301 Allergic rhinitis due to pollen: Secondary | ICD-10-CM | POA: Diagnosis not present

## 2023-07-19 DIAGNOSIS — J301 Allergic rhinitis due to pollen: Secondary | ICD-10-CM | POA: Diagnosis not present

## 2023-07-24 DIAGNOSIS — J301 Allergic rhinitis due to pollen: Secondary | ICD-10-CM | POA: Diagnosis not present

## 2023-07-31 DIAGNOSIS — J301 Allergic rhinitis due to pollen: Secondary | ICD-10-CM | POA: Diagnosis not present

## 2023-08-07 DIAGNOSIS — J301 Allergic rhinitis due to pollen: Secondary | ICD-10-CM | POA: Diagnosis not present

## 2023-08-28 DIAGNOSIS — J301 Allergic rhinitis due to pollen: Secondary | ICD-10-CM | POA: Diagnosis not present

## 2023-11-28 ENCOUNTER — Ambulatory Visit (LOCAL_COMMUNITY_HEALTH_CENTER): Payer: Self-pay

## 2023-11-28 DIAGNOSIS — Z111 Encounter for screening for respiratory tuberculosis: Secondary | ICD-10-CM

## 2023-11-30 ENCOUNTER — Other Ambulatory Visit: Payer: Self-pay

## 2023-11-30 ENCOUNTER — Ambulatory Visit: Payer: Self-pay

## 2023-11-30 DIAGNOSIS — Z111 Encounter for screening for respiratory tuberculosis: Secondary | ICD-10-CM

## 2023-11-30 LAB — TB SKIN TEST
Induration: 0 mm
TB Skin Test: NEGATIVE
